# Patient Record
Sex: Male | Born: 1960 | Race: White | Hispanic: No | Marital: Married | Smoking: Former smoker
Health system: Southern US, Community
[De-identification: ages and names within clinical notes are randomized; demographics above are authoritative.]

## PROBLEM LIST (undated history)

## (undated) DIAGNOSIS — Z5189 Encounter for other specified aftercare: Secondary | ICD-10-CM

## (undated) DIAGNOSIS — E785 Hyperlipidemia, unspecified: Secondary | ICD-10-CM

## (undated) DIAGNOSIS — G4733 Obstructive sleep apnea (adult) (pediatric): Secondary | ICD-10-CM

## (undated) DIAGNOSIS — K219 Gastro-esophageal reflux disease without esophagitis: Secondary | ICD-10-CM

## (undated) DIAGNOSIS — IMO0001 Reserved for inherently not codable concepts without codable children: Secondary | ICD-10-CM

## (undated) DIAGNOSIS — M25579 Pain in unspecified ankle and joints of unspecified foot: Secondary | ICD-10-CM

## (undated) DIAGNOSIS — I471 Supraventricular tachycardia: Secondary | ICD-10-CM

## (undated) DIAGNOSIS — I10 Essential (primary) hypertension: Secondary | ICD-10-CM

## (undated) DIAGNOSIS — G8929 Other chronic pain: Secondary | ICD-10-CM

## (undated) HISTORY — PX: UMBILICAL HERNIA REPAIR: SHX196

## (undated) HISTORY — DX: Encounter for other specified aftercare: Z51.89

## (undated) HISTORY — DX: Gastro-esophageal reflux disease without esophagitis: K21.9

## (undated) HISTORY — DX: Pain in unspecified ankle and joints of unspecified foot: M25.579

## (undated) HISTORY — PX: APPENDECTOMY: SHX54

## (undated) HISTORY — DX: Other chronic pain: G89.29

## (undated) HISTORY — DX: Supraventricular tachycardia: I47.1

## (undated) HISTORY — DX: Essential (primary) hypertension: I10

## (undated) HISTORY — DX: Reserved for inherently not codable concepts without codable children: IMO0001

## (undated) HISTORY — PX: EXPLORATORY LAPAROTOMY: SUR591

## (undated) HISTORY — DX: Obstructive sleep apnea (adult) (pediatric): G47.33

## (undated) HISTORY — DX: Hyperlipidemia, unspecified: E78.5

## (undated) HISTORY — PX: INGUINAL HERNIA REPAIR: SUR1180

---

## 1983-02-27 HISTORY — PX: HUMERUS FRACTURE SURGERY: SHX670

## 2006-02-26 HISTORY — PX: OTHER SURGICAL HISTORY: SHX169

## 2006-04-08 ENCOUNTER — Ambulatory Visit: Payer: Self-pay | Admitting: Surgery

## 2007-06-16 ENCOUNTER — Ambulatory Visit: Payer: Self-pay | Admitting: Internal Medicine

## 2007-06-16 DIAGNOSIS — K644 Residual hemorrhoidal skin tags: Secondary | ICD-10-CM | POA: Insufficient documentation

## 2007-06-16 DIAGNOSIS — E785 Hyperlipidemia, unspecified: Secondary | ICD-10-CM | POA: Insufficient documentation

## 2007-06-16 DIAGNOSIS — E1169 Type 2 diabetes mellitus with other specified complication: Secondary | ICD-10-CM | POA: Insufficient documentation

## 2007-06-17 ENCOUNTER — Encounter: Payer: Self-pay | Admitting: Internal Medicine

## 2007-06-19 LAB — CONVERTED CEMR LAB
ALT: 105 units/L — ABNORMAL HIGH (ref 0–53)
AST: 60 units/L — ABNORMAL HIGH (ref 0–37)
Alkaline Phosphatase: 81 units/L (ref 39–117)
BUN: 9 mg/dL (ref 6–23)
Bilirubin, Direct: 0.1 mg/dL (ref 0.0–0.3)
Chloride: 103 meq/L (ref 96–112)
Creatinine, Ser: 1 mg/dL (ref 0.4–1.5)
Eosinophils Absolute: 0.1 10*3/uL (ref 0.0–0.7)
Eosinophils Relative: 1.3 % (ref 0.0–5.0)
GFR calc Af Amer: 103 mL/min
HCV Ab: POSITIVE — AB
HDL: 41.1 mg/dL (ref >39.0)
Hepatitis B Surface Ag: NEGATIVE
Lymphocytes Relative: 40.2 % (ref 12.0–46.0)
Monocytes Relative: 6.7 % (ref 3.0–12.0)
Neutrophils Relative %: 51.8 % (ref 43.0–77.0)
Platelets: 226 10*3/uL (ref 150–400)
Total CHOL/HDL Ratio: 6.1
Total Protein: 8.3 g/dL (ref 6.0–8.3)
Triglycerides: 160 mg/dL — ABNORMAL HIGH (ref 0–149)
VLDL: 32 mg/dL (ref 0–40)
WBC: 7 10*3/uL (ref 4.5–10.5)

## 2007-07-11 ENCOUNTER — Ambulatory Visit: Payer: Self-pay | Admitting: Internal Medicine

## 2007-07-16 LAB — CONVERTED CEMR LAB

## 2007-12-31 ENCOUNTER — Ambulatory Visit: Payer: Self-pay | Admitting: Internal Medicine

## 2008-02-23 ENCOUNTER — Ambulatory Visit: Payer: Self-pay | Admitting: Family Medicine

## 2008-04-09 ENCOUNTER — Ambulatory Visit: Payer: Self-pay | Admitting: Internal Medicine

## 2008-04-09 LAB — CONVERTED CEMR LAB
Bilirubin Urine: NEGATIVE
Nitrite: NEGATIVE
Urobilinogen, UA: 0.2

## 2008-04-12 LAB — CONVERTED CEMR LAB
Alkaline Phosphatase: 78 units/L (ref 39–117)
BUN: 12 mg/dL (ref 6–23)
Basophils Absolute: 0 10*3/uL (ref 0.0–0.1)
Bilirubin, Direct: 0.2 mg/dL (ref 0.0–0.3)
CO2: 28 meq/L (ref 19–32)
Chloride: 102 meq/L (ref 96–112)
Creatinine, Ser: 0.9 mg/dL (ref 0.4–1.5)
Eosinophils Absolute: 0.1 10*3/uL (ref 0.0–0.7)
GFR calc non Af Amer: 96 mL/min
MCHC: 34.8 g/dL (ref 30.0–36.0)
MCV: 87 fL (ref 78.0–100.0)
Neutrophils Relative %: 45.9 % (ref 43.0–77.0)
Platelets: 234 10*3/uL (ref 150–400)
RDW: 11.8 % (ref 11.5–14.6)
Total Bilirubin: 1.3 mg/dL — ABNORMAL HIGH (ref 0.3–1.2)
WBC: 5.8 10*3/uL (ref 4.5–10.5)

## 2009-01-18 ENCOUNTER — Ambulatory Visit: Payer: Self-pay | Admitting: Family Medicine

## 2009-02-01 ENCOUNTER — Encounter: Payer: Self-pay | Admitting: Family Medicine

## 2009-02-17 ENCOUNTER — Encounter: Payer: Self-pay | Admitting: Internal Medicine

## 2009-04-01 ENCOUNTER — Telehealth (INDEPENDENT_AMBULATORY_CARE_PROVIDER_SITE_OTHER): Payer: Self-pay

## 2009-05-18 ENCOUNTER — Ambulatory Visit: Payer: Self-pay | Admitting: Family Medicine

## 2009-05-18 DIAGNOSIS — R7402 Elevation of levels of lactic acid dehydrogenase (LDH): Secondary | ICD-10-CM | POA: Insufficient documentation

## 2009-05-18 DIAGNOSIS — R740 Nonspecific elevation of levels of transaminase and lactic acid dehydrogenase [LDH]: Secondary | ICD-10-CM

## 2009-05-18 DIAGNOSIS — E119 Type 2 diabetes mellitus without complications: Secondary | ICD-10-CM

## 2009-05-18 LAB — CONVERTED CEMR LAB
ALT: 79 units/L — ABNORMAL HIGH (ref 0–53)
AST: 54 units/L — ABNORMAL HIGH (ref 0–37)
Bilirubin, Direct: 0.1 mg/dL (ref 0.0–0.3)
Blood Glucose, Fasting: 120 mg/dL
Creatinine,U: 151.2 mg/dL
Total Bilirubin: 0.9 mg/dL (ref 0.3–1.2)

## 2009-05-19 ENCOUNTER — Encounter: Payer: Self-pay | Admitting: Family Medicine

## 2009-05-23 ENCOUNTER — Encounter: Payer: Self-pay | Admitting: Family Medicine

## 2009-06-13 ENCOUNTER — Encounter: Admission: RE | Admit: 2009-06-13 | Discharge: 2009-06-13 | Payer: Self-pay | Admitting: Family Medicine

## 2009-06-14 ENCOUNTER — Encounter: Payer: Self-pay | Admitting: Family Medicine

## 2009-06-23 ENCOUNTER — Ambulatory Visit: Payer: Self-pay | Admitting: Family Medicine

## 2009-10-25 ENCOUNTER — Ambulatory Visit: Payer: Self-pay | Admitting: Family Medicine

## 2009-10-25 DIAGNOSIS — B86 Scabies: Secondary | ICD-10-CM

## 2009-12-06 ENCOUNTER — Ambulatory Visit: Payer: Self-pay | Admitting: Family Medicine

## 2009-12-07 LAB — CONVERTED CEMR LAB
ALT: 38 units/L (ref 0–53)
AST: 30 units/L (ref 0–37)
Alkaline Phosphatase: 71 units/L (ref 39–117)
BUN: 13 mg/dL (ref 6–23)
Basophils Relative: 0.7 % (ref 0.0–3.0)
Chloride: 105 meq/L (ref 96–112)
Cholesterol: 204 mg/dL — ABNORMAL HIGH (ref 0–200)
Direct LDL: 138 mg/dL
Eosinophils Relative: 2 % (ref 0.0–5.0)
HCT: 46.9 % (ref 39.0–52.0)
Hemoglobin: 16.1 g/dL (ref 13.0–17.0)
Hgb A1c MFr Bld: 6.3 % (ref 4.6–6.5)
Lymphocytes Relative: 34.3 % (ref 12.0–46.0)
Lymphs Abs: 2 10*3/uL (ref 0.7–4.0)
Monocytes Relative: 7.9 % (ref 3.0–12.0)
Neutro Abs: 3.3 10*3/uL (ref 1.4–7.7)
Potassium: 4.6 meq/L (ref 3.5–5.1)
RBC: 5.28 M/uL (ref 4.22–5.81)
RDW: 12.7 % (ref 11.5–14.6)
Sodium: 137 meq/L (ref 135–145)
Total Bilirubin: 0.9 mg/dL (ref 0.3–1.2)
Total CHOL/HDL Ratio: 5
VLDL: 18.2 mg/dL (ref 0.0–40.0)
WBC: 6 10*3/uL (ref 4.5–10.5)

## 2009-12-29 ENCOUNTER — Encounter (INDEPENDENT_AMBULATORY_CARE_PROVIDER_SITE_OTHER): Payer: Self-pay | Admitting: *Deleted

## 2009-12-29 ENCOUNTER — Ambulatory Visit: Payer: Self-pay | Admitting: Family Medicine

## 2009-12-29 DIAGNOSIS — M25519 Pain in unspecified shoulder: Secondary | ICD-10-CM

## 2009-12-29 DIAGNOSIS — M79609 Pain in unspecified limb: Secondary | ICD-10-CM

## 2009-12-29 DIAGNOSIS — I10 Essential (primary) hypertension: Secondary | ICD-10-CM

## 2009-12-29 LAB — HM DIABETES FOOT EXAM

## 2009-12-29 LAB — CONVERTED CEMR LAB: LDL Goal: 70 mg/dL

## 2010-01-12 ENCOUNTER — Telehealth: Payer: Self-pay | Admitting: Family Medicine

## 2010-02-17 ENCOUNTER — Encounter (INDEPENDENT_AMBULATORY_CARE_PROVIDER_SITE_OTHER): Payer: Self-pay | Admitting: *Deleted

## 2010-02-21 ENCOUNTER — Ambulatory Visit: Payer: Self-pay | Admitting: Family Medicine

## 2010-02-22 ENCOUNTER — Encounter (INDEPENDENT_AMBULATORY_CARE_PROVIDER_SITE_OTHER): Payer: Self-pay | Admitting: *Deleted

## 2010-02-22 ENCOUNTER — Ambulatory Visit: Payer: Self-pay | Admitting: Gastroenterology

## 2010-02-28 ENCOUNTER — Telehealth: Payer: Self-pay | Admitting: Family Medicine

## 2010-03-01 ENCOUNTER — Encounter: Payer: Self-pay | Admitting: Family Medicine

## 2010-03-03 ENCOUNTER — Ambulatory Visit: Admit: 2010-03-03 | Payer: Self-pay | Admitting: Gastroenterology

## 2010-03-16 ENCOUNTER — Ambulatory Visit: Admit: 2010-03-16 | Payer: Self-pay | Admitting: Family Medicine

## 2010-03-16 ENCOUNTER — Encounter (INDEPENDENT_AMBULATORY_CARE_PROVIDER_SITE_OTHER): Payer: Self-pay | Admitting: *Deleted

## 2010-03-22 ENCOUNTER — Ambulatory Visit: Admit: 2010-03-22 | Payer: Self-pay | Admitting: Family Medicine

## 2010-03-28 NOTE — Letter (Signed)
Summary: Orders/[facility]  Orders/[facility]   Imported By: Lanelle Bal 05/26/2009 14:11:55  _____________________________________________________________________  External Attachment:    Type:   Image     Comment:   External Document

## 2010-03-28 NOTE — Letter (Signed)
Summary: Pre Visit Letter Revised  [facility]  [address] [place], [place] [address]   Phone: [phone]  Fax: [phone]        12/29/2009 MRN: [id number] ADAM SANJUAN [address] [place], [place]  [address]             Procedure Date:  03/03/2010   Welcome to the Gastroenterology Division at [facility].    You are scheduled to see a nurse for your pre-procedure visit on 02/23/2011 at 8:00AM on the 3rd floor at [facility], [address]  We ask that you try to arrive at our office 15 minutes prior to your appointment time to allow for check-in.  Please take a minute to review the attached form.  If you answer "Yes" to one or more of the questions on the first page, we ask that you call the person listed at your earliest opportunity.  If you answer "No" to all of the questions, please complete the rest of the form and bring it to your appointment.    Your nurse visit will consist of discussing your medical and surgical history, your immediate family medical history, and your medications.   If you are unable to list all of your medications on the form, please bring the medication bottles to your appointment and we will list them.  We will need to be aware of both prescribed and over the counter drugs.  We will need to know exact dosage information as well.    Please be prepared to read and sign documents such as consent forms, a financial agreement, and acknowledgement forms.  If necessary, and with your consent, a friend or relative is welcome to sit-in on the nurse visit with you.  Please bring your insurance card so that we may make a copy of it.  If your insurance requires a referral to see a specialist, please bring your referral form from your primary care physician.  No co-pay is required for this nurse visit.     If you cannot keep your appointment, please call [phone] to cancel or reschedule prior to your appointment date.  This allows  [place] the opportunity to schedule an appointment for another patient in need of care.    Thank you for choosing [facility] for your medical needs.  We appreciate the opportunity to care for you.  Please visit [place] at our website  to learn more about our practice.  Sincerely, The Gastroenterology Division

## 2010-03-28 NOTE — Miscellaneous (Signed)
Summary: PT Discharge/[facility]  PT Discharge/[facility]   Imported By: Lanelle Bal 02/28/2009 11:18:43  _____________________________________________________________________  External Attachment:    Type:   Image     Comment:   External Document
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
## 2010-03-28 NOTE — Letter (Signed)
Summary: [facility]  [facility]   Imported By: Lanelle Bal 06/21/2009 08:55:36  _____________________________________________________________________  External Attachment:    Type:   Image     Comment:   External Document

## 2010-03-28 NOTE — Progress Notes (Signed)
Summary: stopped simvastatin  Phone Note Call from Patient Call back at Home Phone [phone]   Caller: Patient Call For: Hannah Beat MD Summary of Call: Pt states he took simvastatin for a week.  He started getting pain in his neck, upper arm and thighs.  He stopped the medicine 2 days ago and the pain went away.  He said he is willing to try it again if you think the pain was caused by something else.  Please advise. Initial call taken by: Lowella Petties CMA, AAMA,  January 12, 2010 2:40 PM  Follow-up for Phone Call        likely is statin related, would cycle off. may be ok to reintroduce at the lowest dose in a few months Hannah Beat MD  January 13, 2010 10:16 AM   Advised pt, medicine removed from med list. Follow-up by: Lowella Petties CMA, AAMA,  January 13, 2010 11:35 AM     Prior Medications: ALEVE 220 MG TABS (NAPROXEN SODIUM) as needed METFORMIN HCL 500 MG XR24H-TAB (METFORMIN HCL) 1 by mouth daily GLUCOMETER () Check bs two times a day (250.00) GLUCOMETER GENERIC LANCETS () Check bs two times a day or as directed GLUCOMETER GENERIC TEST STRIPS () Check two times a day or as needed (250.00) FLUOCINOLONE ACETONIDE 0.025 % CREA (FLUOCINOLONE ACETONIDE) apply to area two times a day as needed itching CELEBREX 100 MG CAPS (CELECOXIB) patient not sure of doseage LISINOPRIL 10 MG TABS (LISINOPRIL) 1 by mouth daily UREA 40 % CREA (UREA) Apply two times a day to affected areas, dispense 1 month supply CELEBREX 200 MG CAPS (CELECOXIB) 1 by mouth daily (failure multiple other NSAIDS) Current Allergies: No known allergies
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
## 2010-03-30 NOTE — Letter (Signed)
Summary: Diabetic Instructions   Gastroenterology  [address] [place], [place] [address]   Phone: [phone]  Fax: [phone]    Omar Willis 1960-09-29 MRN: [id number]   (Metformin)  ORAL DIABETIC MEDICATION INSTRUCTIONS  The day before your procedure:   Take your diabetic pill as you do normally  The day of your procedure:   Do not take your diabetic pill    We will check your blood sugar levels during the admission process and again in Recovery before discharging you home  ________________________________________________________________________

## 2010-03-30 NOTE — Letter (Signed)
Summary: [facility] Instructions  [facility]  [address] [place], [place] [address]   Phone: [phone]  Fax: [phone]       EVANGELOS PAULINO    [birth year]    MRN: [id number]        Procedure Day /Date: Friday 03/03/10     Arrival Time: 9:00AM     Procedure Time: 10:00AM     Location of Procedure:                    _X_  [facility] (4th Floor)                        PREPARATION FOR COLONOSCOPY WITH MOVIPREP   Starting 5 days prior to your procedure 02/26/2010 do not eat nuts, seeds, popcorn, corn, beans, peas,  salads, or any raw vegetables.  Do not take any fiber supplements (e.g. Metamucil, Citrucel, and Benefiber).  THE DAY BEFORE YOUR PROCEDURE         DATE: Thurs.  DAY: 03/02/2010  1.  Drink clear liquids the entire day-NO SOLID FOOD  2.  Do not drink anything colored red or purple.  Avoid juices with pulp.  No orange juice.  3.  Drink at least 64 oz. (8 glasses) of fluid/clear liquids during the day to prevent dehydration and help the prep work efficiently.  CLEAR LIQUIDS INCLUDE: Water Jello Ice Popsicles Tea (sugar ok, no milk/cream) Powdered fruit flavored drinks Coffee (sugar ok, no milk/cream) Gatorade Juice: apple, white grape, white cranberry  Lemonade Clear bullion, consomm, broth Carbonated beverages (any kind) Strained chicken noodle soup Hard Candy                             4.  In the morning, mix first dose of MoviPrep solution:    Empty 1 Pouch A and 1 Pouch B into the disposable container    Add lukewarm drinking water to the top line of the container. Mix to dissolve    Refrigerate (mixed solution should be used within 24 hrs)  5.  Begin drinking the prep at 5:00 p.m. The MoviPrep container is divided by 4 marks.   Every 15 minutes drink the solution down to the next mark (approximately 8 oz) until the full liter is complete.   6.  Follow completed prep with 16 oz of clear liquid of your choice (Nothing  red or purple).  Continue to drink clear liquids until bedtime.  7.  Before going to bed, mix second dose of MoviPrep solution:    Empty 1 Pouch A and 1 Pouch B into the disposable container    Add lukewarm drinking water to the top line of the container. Mix to dissolve    Refrigerate  THE DAY OF YOUR PROCEDURE      DATE: 03/03/2010  DAY: Caleen Essex.  Beginning at 5:00 a.m. (5 hours before procedure):         1. Every 15 minutes, drink the solution down to the next mark (approx 8 oz) until the full liter is complete.  2. Follow completed prep with 16 oz. of clear liquid of your choice.    3. You may drink clear liquids until 8:00AM (2 HOURS BEFORE PROCEDURE).   MEDICATION INSTRUCTIONS  Unless otherwise instructed, you should take regular prescription medications with a small sip of water   as early as possible the morning of your procedure.  Diabetic patients - see separate instructions.           OTHER INSTRUCTIONS  You will need a responsible adult at least 51 years of age to accompany you and drive you home.   This person must remain in the waiting room during your procedure.  Wear loose fitting clothing that is easily removed.  Leave jewelry and other valuables at home.  However, you may wish to bring a book to read or  an iPod/MP3 player to listen to music as you wait for your procedure to start.  Remove all body piercing jewelry and leave at home.  Total time from sign-in until discharge is approximately 2-3 hours.  You should go home directly after your procedure and rest.  You can resume normal activities the  day after your procedure.  The day of your procedure you should not:   Drive   Make legal decisions   Operate machinery   Drink alcohol   Return to work  You will receive specific instructions about eating, activities and medications before you leave.    The above instructions have been reviewed and explained to me by   Ezra Sites RN  February 22, 2010 8:20 AM     I fully understand and can verbalize these instructions _____________________________ Date _________

## 2010-03-30 NOTE — Miscellaneous (Signed)
Summary: [facility]  Clinical Lists Changes  Medications: Added new medication of MOVIPREP 100 GM  SOLR (PEG-KCL-NACL-NASULF-NA ASC-C) As per prep instructions. - Signed Rx of MOVIPREP 100 GM  SOLR (PEG-KCL-NACL-NASULF-NA ASC-C) As per prep instructions.;  #1 x 0;  Signed;  Entered by: Ezra Sites RN;  Authorized by: Rachael Fee MD;  Method used: Electronically to [facility]*, [address], [place], [place]  [address], Ph: [phone], Fax: [phone] Observations: Added new observation of NKA: T (02/22/2010 8:01)    Prescriptions: MOVIPREP 100 GM  SOLR (PEG-KCL-NACL-NASULF-NA ASC-C) As per prep instructions.  #1 x 0   Entered by:   Ezra Sites RN   Authorized by:   Rachael Fee MD   Signed by:   Ezra Sites RN on 02/22/2010   Method used:   Electronically to        [facility]* (retail)       [address]       [place], [place]  [address]       Ph: [phone]       Fax: [phone]   RxID:   [id number]

## 2010-03-30 NOTE — Progress Notes (Signed)
Summary: prior auth needed for celebrex  Phone Note From Pharmacy   Caller: [facility]*/ [employer] Summary of Call: Prior Berkley Harvey is needed for celebrex, form is on your desk.                 Lowella Petties CMA, AAMA  February 28, 2010 3:15 PM      Appended Document: prior auth needed for celebrex Prior auth given for celebrex, approval letter placed on doctor's desk for signature and scanning.

## 2010-03-30 NOTE — Medication Information (Signed)
Summary: PA and Approval for Celebrex  PA and Approval for Celebrex   Imported By: Maryln Gottron 03/07/2010 14:25:28  _____________________________________________________________________  External Attachment:    Type:   Image     Comment:   External Document

## 2010-03-30 NOTE — Letter (Signed)
Summary: [facility] No Show Letter  [place] [facility]  [address] [place], [place] [address]   Phone: [phone]  Fax: [phone]    03/16/2010 MRN: [id number]  KELVIS BERGER [address] [place], [place]  [address]   Dear Mr. ANGLIN,   Our records indicate that you missed your scheduled appointment with ___lab__________________ on _1.19.2012_________.  Please contact this office to reschedule your appointment as soon as possible.  It is important that you keep your scheduled appointments with your physician, so we can provide you the best care possible.  Please be advised that there may be a charge for "no show" appointments.    Sincerely,   [place] [facility]

## 2010-05-25 ENCOUNTER — Telehealth: Payer: Self-pay | Admitting: Family Medicine

## 2010-05-25 DIAGNOSIS — E785 Hyperlipidemia, unspecified: Secondary | ICD-10-CM

## 2010-05-25 DIAGNOSIS — Z79899 Other long term (current) drug therapy: Secondary | ICD-10-CM

## 2010-05-25 NOTE — Telephone Encounter (Signed)
Future lab order  

## 2010-05-26 ENCOUNTER — Other Ambulatory Visit (INDEPENDENT_AMBULATORY_CARE_PROVIDER_SITE_OTHER): Payer: PRIVATE HEALTH INSURANCE | Admitting: Family Medicine

## 2010-05-26 DIAGNOSIS — Z79899 Other long term (current) drug therapy: Secondary | ICD-10-CM

## 2010-05-26 DIAGNOSIS — E785 Hyperlipidemia, unspecified: Secondary | ICD-10-CM

## 2010-05-26 LAB — HEPATIC FUNCTION PANEL
AST: 25 U/L (ref 0–37)
Alkaline Phosphatase: 69 U/L (ref 39–117)
Bilirubin, Direct: 0.2 mg/dL (ref 0.0–0.3)
Total Bilirubin: 1.1 mg/dL (ref 0.3–1.2)

## 2010-05-26 LAB — BASIC METABOLIC PANEL
CO2: 26 mEq/L (ref 19–32)
Calcium: 9.6 mg/dL (ref 8.4–10.5)
Chloride: 106 mEq/L (ref 96–112)
Creatinine, Ser: 0.9 mg/dL (ref 0.4–1.5)
Glucose, Bld: 101 mg/dL — ABNORMAL HIGH (ref 70–99)

## 2010-05-26 LAB — LIPID PANEL
LDL Cholesterol: 120 mg/dL — ABNORMAL HIGH (ref 0–99)
Total CHOL/HDL Ratio: 4

## 2010-05-31 ENCOUNTER — Encounter: Payer: Self-pay | Admitting: Family Medicine

## 2010-06-01 ENCOUNTER — Ambulatory Visit (INDEPENDENT_AMBULATORY_CARE_PROVIDER_SITE_OTHER): Payer: PRIVATE HEALTH INSURANCE | Admitting: Family Medicine

## 2010-06-01 ENCOUNTER — Encounter: Payer: Self-pay | Admitting: Family Medicine

## 2010-06-01 DIAGNOSIS — M719 Bursopathy, unspecified: Secondary | ICD-10-CM

## 2010-06-01 DIAGNOSIS — M25519 Pain in unspecified shoulder: Secondary | ICD-10-CM

## 2010-06-01 DIAGNOSIS — E119 Type 2 diabetes mellitus without complications: Secondary | ICD-10-CM

## 2010-06-01 DIAGNOSIS — R7309 Other abnormal glucose: Secondary | ICD-10-CM

## 2010-06-01 DIAGNOSIS — M67919 Unspecified disorder of synovium and tendon, unspecified shoulder: Secondary | ICD-10-CM

## 2010-06-01 DIAGNOSIS — F329 Major depressive disorder, single episode, unspecified: Secondary | ICD-10-CM

## 2010-06-01 DIAGNOSIS — I10 Essential (primary) hypertension: Secondary | ICD-10-CM

## 2010-06-01 DIAGNOSIS — E785 Hyperlipidemia, unspecified: Secondary | ICD-10-CM

## 2010-06-01 DIAGNOSIS — I6350 Cerebral infarction due to unspecified occlusion or stenosis of unspecified cerebral artery: Secondary | ICD-10-CM

## 2010-06-01 DIAGNOSIS — M7580 Other shoulder lesions, unspecified shoulder: Secondary | ICD-10-CM

## 2010-06-01 MED ORDER — ATORVASTATIN CALCIUM 10 MG PO TABS: 10.0000 mg | ORAL_TABLET | Freq: Every day | ORAL | Status: DC

## 2010-06-01 MED ORDER — LISINOPRIL 10 MG PO TABS: 10.0000 mg | ORAL_TABLET | Freq: Every day | ORAL | Status: DC

## 2010-06-01 NOTE — Patient Instructions (Signed)
F/u 3 months  Labs a few days before

## 2010-06-01 NOTE — Progress Notes (Signed)
50 year old several problems:  Diabetes Mellitus: Tolerating Medications: Compliance with diet: good Exercise: minimal outside of work but very active their Avg blood sugars at home: 100-130 Foot problems: none Hypoglycemia: none No nausea, vomitting, blurred vision, polyuria. No recent eye exam  Lab Results  Component Value Date   HGBA1C 6.3 12/06/2009      Chemistry      Component Value Date/Time   NA 139 05/26/2010 0955   K 4.4 05/26/2010 0955   CL 106 05/26/2010 0955   CO2 26 05/26/2010 0955   BUN 16 05/26/2010 0955   CREATININE 0.9 05/26/2010 0955      Component Value Date/Time   CALCIUM 9.6 05/26/2010 0955   ALKPHOS 69 05/26/2010 0955   AST 25 05/26/2010 0955   ALT 32 05/26/2010 0955   BILITOT 1.1 05/26/2010 0955      Lipids: not at goal Panel reviewed with patient. We had d/c meds previously with AST/ALT elevation and he had myalgias.  Lab Results  Component Value Date   CHOL 178 05/26/2010   CHOL 204* 12/06/2009   CHOL 250* 06/16/2007   Lab Results  Component Value Date   HDL 40.00 05/26/2010   HDL 69.62 12/06/2009   HDL 41.1 06/16/2007   Lab Results  Component Value Date   LDLCALC 120* 05/26/2010   Lab Results  Component Value Date   TRIG 89.0 05/26/2010   TRIG 91.0 12/06/2009   TRIG 160* 06/16/2007   Lab Results  Component Value Date   CHOLHDL 4 05/26/2010   CHOLHDL 5 12/06/2009   CHOLHDL 6.1 CALC 06/16/2007    Lab Results  Component Value Date   ALT 32 05/26/2010   AST 25 05/26/2010   ALKPHOS 69 05/26/2010   BILITOT 1.1 05/26/2010    HTN: ran out of BP meds - elevated now No CP, no sob. No HA.  BP Readings from Last 3 Encounters:  06/01/10 130/90  12/29/09 140/88  10/25/09 140/100    The patient noted above presents with shoulder pain that has been ongoing for many months, but now worsening over the last 2-3. there is history of distant humeral fx with ORIF, s/p hardware removal. The patient denies neck pain or radicular symptoms. Denies  dislocation, subluxation, separation of the shoulder. The patient does complain of pain in the overhead plane and restriction of motion.  Medications Tried: Alleve, Celebrex Ice or Heat: both Tried PT: No  The PMH, PSH, Social History, Family History, Medications, and allergies have been reviewed in [facility], and have been updated if relevant.  GEN: WDWN, NAD, Non-toxic, A & O x 3 HEENT: Atraumatic, Normocephalic. Neck supple. No masses, No LAD. Ears and Nose: No external deformity. CV: RRR, No M/G/R. No JVD. No thrill. No extra heart sounds. PULM: CTA B, no wheezes, crackles, rhonchi. No retractions. No resp. distress. No accessory muscle use. EXTR: No c/c/e NEURO Normal gait.  PSYCH: Normally interactive. Conversant. Not depressed or anxious appearing.  Calm demeanor.   GEN: Well-developed,well-nourished,in no acute distress; alert,appropriate and cooperative throughout examination HEENT: Normocephalic and atraumatic without obvious abnormalities. Ears, externally no deformities PULM: Breathing comfortably in no respiratory distress EXT: No clubbing, cyanosis, or edema PSYCH: Normally interactive. Cooperative during the interview. Pleasant. Friendly and conversant. Not anxious or depressed appearing. Normal, full affect.   Shoulder: R Inspection: No muscle wasting or winging Notable crepitus Ecchymosis/edema: neg  AC joint, scapula, clavicle: NT Cervical spine: NT, full ROM Spurling's: neg Abduction: loss 15 deg, 5/5 Flexion: full, 5/5  IR, loss 20 deg, lift-off: 5/5 ER at neutral: loss 10 deg, 5/5 AC crossover: pos Neer: pos Hawkins: pos Drop Test: neg Empty Can: pos Supraspinatus insertion: mild-mod T Bicipital groove: NT Speed's: neg Yergason's: neg Sulcus sign: neg Scapular dyskinesis: none C5-T1 intact  Neuro: Sensation intact Grip 5/5  A/P: diabetes mellitus: At goal, continue medicines Hypertension: Not at goal, restart lisinopril Hyperlipidemia: Not a goal,  start low-dose Lipitor  And recheck fasting lipid panel and hepatic panel in 3 months RIGHT shoulder pain, and subacromial impingement and rotator cuff tendinopathy.  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 Risks, benefits, and alternatives were explained. Patient prepped with Betadine and Ethyl Chloride used for anesthesia. The subacromial space was injected using the posterior approach. The patient tolerated the procedure well and had decreased pain post injection. No complications. Injection: 9 cc of Lidocaine 1% and 1cc of Kenalog 40 mg. Needle: 22 gauge

## 2010-06-06 ENCOUNTER — Other Ambulatory Visit: Payer: Self-pay | Admitting: *Deleted

## 2010-06-06 MED ORDER — CELECOXIB 200 MG PO CAPS: 200.0000 mg | ORAL_CAPSULE | Freq: Every day | ORAL | Status: DC

## 2010-06-06 MED ORDER — METFORMIN HCL ER 500 MG PO TB24: 500.0000 mg | ORAL_TABLET | Freq: Every day | ORAL | Status: DC

## 2010-06-14 ENCOUNTER — Other Ambulatory Visit: Payer: Self-pay | Admitting: *Deleted

## 2010-06-14 MED ORDER — METFORMIN HCL ER 500 MG PO TB24: 500.0000 mg | ORAL_TABLET | Freq: Every day | ORAL | Status: DC

## 2010-06-14 MED ORDER — CELECOXIB 200 MG PO CAPS: 200.0000 mg | ORAL_CAPSULE | Freq: Every day | ORAL | Status: DC

## 2010-06-15 NOTE — Telephone Encounter (Signed)
[name], has this been taken care of? 

## 2010-08-24 ENCOUNTER — Telehealth: Payer: Self-pay | Admitting: *Deleted

## 2010-08-24 ENCOUNTER — Other Ambulatory Visit (INDEPENDENT_AMBULATORY_CARE_PROVIDER_SITE_OTHER): Payer: PRIVATE HEALTH INSURANCE | Admitting: Family Medicine

## 2010-08-24 DIAGNOSIS — E785 Hyperlipidemia, unspecified: Secondary | ICD-10-CM

## 2010-08-24 DIAGNOSIS — E119 Type 2 diabetes mellitus without complications: Secondary | ICD-10-CM

## 2010-08-24 LAB — HEPATIC FUNCTION PANEL
ALT: 33 U/L (ref 0–53)
Albumin: 4.7 g/dL (ref 3.5–5.2)
Alkaline Phosphatase: 68 U/L (ref 39–117)
Bilirubin, Direct: 0.1 mg/dL (ref 0.0–0.3)
Total Protein: 7.6 g/dL (ref 6.0–8.3)

## 2010-08-24 LAB — HEMOGLOBIN A1C: Hgb A1c MFr Bld: 6.5 % (ref 4.6–6.5)

## 2010-08-24 LAB — LIPID PANEL
Cholesterol: 199 mg/dL (ref 0–200)
Triglycerides: 65 mg/dL (ref 0.0–149.0)
VLDL: 13 mg/dL (ref 0.0–40.0)

## 2010-08-24 MED ORDER — DOXYCYCLINE HYCLATE 100 MG PO CAPS: 100.0000 mg | ORAL_CAPSULE | Freq: Two times a day (BID) | ORAL | Status: DC

## 2010-08-24 NOTE — Telephone Encounter (Signed)
Call  Lyme titers would not be positive yet  Ok to call in  Doxycycline 100 mg, 1 po bid x 10 days, #20

## 2010-08-24 NOTE — Telephone Encounter (Signed)
Pt came in for blood work for A1C this morning.  He states that he has had several ticks on him and he has noticed some joint pain for the last 2 weeks.  He would like to be checked for Lyme's Disease, he has DM follow up appt with you in one week.  An extra tube of blood was drawn in case you agreed to the lyme's tests.  Please advise.

## 2010-08-24 NOTE — Telephone Encounter (Signed)
Patient advised and rx sent to pharmacy  

## 2010-08-31 ENCOUNTER — Encounter: Payer: Self-pay | Admitting: Family Medicine

## 2010-08-31 ENCOUNTER — Ambulatory Visit (INDEPENDENT_AMBULATORY_CARE_PROVIDER_SITE_OTHER): Payer: PRIVATE HEALTH INSURANCE | Admitting: Family Medicine

## 2010-08-31 DIAGNOSIS — I10 Essential (primary) hypertension: Secondary | ICD-10-CM

## 2010-08-31 DIAGNOSIS — E785 Hyperlipidemia, unspecified: Secondary | ICD-10-CM

## 2010-08-31 DIAGNOSIS — Z125 Encounter for screening for malignant neoplasm of prostate: Secondary | ICD-10-CM | POA: Insufficient documentation

## 2010-08-31 DIAGNOSIS — E119 Type 2 diabetes mellitus without complications: Secondary | ICD-10-CM

## 2010-08-31 DIAGNOSIS — Z79899 Other long term (current) drug therapy: Secondary | ICD-10-CM

## 2010-08-31 MED ORDER — NIACIN ER (ANTIHYPERLIPIDEMIC) 500 MG PO TBCR: 500.0000 mg | EXTENDED_RELEASE_TABLET | Freq: Every day | ORAL | Status: DC

## 2010-08-31 MED ORDER — NIACIN ER (ANTIHYPERLIPIDEMIC) 1000 MG PO TBCR: 1000.0000 mg | EXTENDED_RELEASE_TABLET | Freq: Every day | ORAL | Status: DC

## 2010-08-31 NOTE — Patient Instructions (Signed)
TAKE THE NIASPAN 500 MG 1 TABLET AT NIGHT BEFORE BED. AFTER 2 WEEKS, INCREASE TO 2 TABLETS  (THE MAIL OFF REFILL WILL BE TWICE AS STRONG, 1000 MG) Take a coated 81 mg aspirin before you take this medication  F/u 6 months for CPX Labs before

## 2010-08-31 NOTE — Progress Notes (Signed)
Omar Willis, a 50 y.o. male presents today in the office for the following:   Diabetes Mellitus: Tolerating Medications: y Compliance with diet: good Exercise: active Avg blood sugars at home: good Foot problems: none Hypoglycemia: none No nausea, vomitting, blurred vision, polyuria.  Lab Results  Component Value Date   HGBA1C 6.5 08/24/2010    Basic Metabolic Panel:    Component Value Date/Time   NA 139 05/26/2010 0955   K 4.4 05/26/2010 0955   CL 106 05/26/2010 0955   CO2 26 05/26/2010 0955   BUN 16 05/26/2010 0955   CREATININE 0.9 05/26/2010 0955   GLUCOSE 101* 05/26/2010 0955   CALCIUM 9.6 05/26/2010 0955   HTN: Tolerating all medications without side effects Stable and at goal No CP, no sob. No HA.  BP Readings from Last 3 Encounters:  08/31/10 130/80  06/01/10 130/90  12/29/09 140/88    Basic Metabolic Panel:    Component Value Date/Time   NA 139 05/26/2010 0955   K 4.4 05/26/2010 0955   CL 106 05/26/2010 0955   CO2 26 05/26/2010 0955   BUN 16 05/26/2010 0955   CREATININE 0.9 05/26/2010 0955   GLUCOSE 101* 05/26/2010 0955   CALCIUM 9.6 05/26/2010 0955   Lipids: bad myalgias Panel reviewed with patient.  Lipids:    Component Value Date/Time   CHOL 199 08/24/2010 0921   TRIG 65.0 08/24/2010 0921   HDL 43.50 08/24/2010 0921   LDLDIRECT 138.0 12/06/2009 0907   VLDL 13.0 08/24/2010 0921   CHOLHDL 5 08/24/2010 0921    Lab Results  Component Value Date   ALT 33 08/24/2010   AST 21 08/24/2010   ALKPHOS 68 08/24/2010   BILITOT 1.0 08/24/2010   HTN: Tolerating all medications without side effects Stable and at goal No CP, no sob. No HA.  BP Readings from Last 3 Encounters:  08/31/10 130/80  06/01/10 130/90  12/29/09 140/88    Basic Metabolic Panel:    Component Value Date/Time   NA 139 05/26/2010 0955   K 4.4 05/26/2010 0955   CL 106 05/26/2010 0955   CO2 26 05/26/2010 0955   BUN 16 05/26/2010 0955   CREATININE 0.9 05/26/2010 0955   GLUCOSE 101* 05/26/2010  0955   CALCIUM 9.6 05/26/2010 0955   The PMH, PSH, Social History, Family History, Medications, and allergies have been reviewed in [facility], and have been updated if relevant.  ROS: GEN: No acute illnesses, no fevers, chills. GI: No n/v/d, eating normally Pulm: No SOB Interactive and getting along well at home.  Otherwise, ROS is as per the HPI.   Physical Exam  Blood pressure 130/80, pulse 72, temperature 97.7 F (36.5 C), temperature source Oral, height 6\' 3"  (1.905 m), weight 205 lb 6.4 oz (93.169 kg), SpO2 98.00%.  GEN: WDWN, NAD, Non-toxic, A & O x 3 HEENT: Atraumatic, Normocephalic. Neck supple. No masses, No LAD. Ears and Nose: No external deformity. CV: RRR, No M/G/R. No JVD. No thrill. No extra heart sounds. PULM: CTA B, no wheezes, crackles, rhonchi. No retractions. No resp. distress. No accessory muscle use. ABD: S, NT, ND, +BS. No rebound tenderness. No HSM.  EXTR: No c/c/e NEURO Normal gait.  PSYCH: Normally interactive. Conversant. Not depressed or anxious appearing.  Calm demeanor.  Diabetic foot exam: Normal inspection No skin breakdown No calluses  Normal DP pulses Normal sensation to light tough Nails normal  Assessment and Plan: 1.  Diabetes: doing well 2. Lipids, unstable, titrate up niacin. ASA before 3. HTN;  at goal

## 2010-09-01 ENCOUNTER — Encounter: Payer: Self-pay | Admitting: Family Medicine

## 2010-10-17 ENCOUNTER — Telehealth: Payer: Self-pay | Admitting: *Deleted

## 2010-10-17 DIAGNOSIS — M2550 Pain in unspecified joint: Secondary | ICD-10-CM

## 2010-10-17 NOTE — Telephone Encounter (Signed)
ordered

## 2010-10-17 NOTE — Telephone Encounter (Signed)
Pt was treated a couple of months ago with antibiotic for possible lyme's disease.  His symptoms improved but have now returned- joint pain, neck pain, feels like he has the flu.  His wife is asking if he should be given more antibiotics.  Please advise.  Uses [facility].

## 2010-10-17 NOTE — Telephone Encounter (Signed)
No, i would not use more antibiotics. We could draw lyme titers if he would like to see if he did have lyme - at this point they should be positive if he had that infection. Would not do any more meds without doing that

## 2010-10-17 NOTE — Telephone Encounter (Signed)
Patients wife advised and they will call for appt for labs can you please order these

## 2010-10-19 ENCOUNTER — Other Ambulatory Visit (INDEPENDENT_AMBULATORY_CARE_PROVIDER_SITE_OTHER): Payer: PRIVATE HEALTH INSURANCE | Admitting: Family Medicine

## 2010-10-19 DIAGNOSIS — M2550 Pain in unspecified joint: Secondary | ICD-10-CM

## 2010-10-25 ENCOUNTER — Telehealth: Payer: Self-pay | Admitting: *Deleted

## 2010-10-25 MED ORDER — GABAPENTIN 300 MG PO CAPS: 300.0000 mg | ORAL_CAPSULE | Freq: Three times a day (TID) | ORAL | Status: DC

## 2010-10-25 NOTE — Telephone Encounter (Signed)
He is taken 300mg  tid right now. [facility] for one month .

## 2010-10-25 NOTE — Telephone Encounter (Signed)
Pt's wife states pt's feet and ankles are swollen and painful, left is worse than right- due to his diabetes. Has pain in toes and ball of feet.  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   He took some of her gabapentin and it did help the pain.  Wife is asking if pt can get a script for this, or would he need to be seen first.  She says the patient has discussed this problem with you before.

## 2010-10-25 NOTE — Telephone Encounter (Signed)
Sent in

## 2010-10-25 NOTE — Telephone Encounter (Signed)
That's fine.  What dose is he taking now - strength and frequency.  i know him well

## 2010-10-31 ENCOUNTER — Ambulatory Visit: Payer: Self-pay

## 2010-11-01 ENCOUNTER — Telehealth: Payer: Self-pay | Admitting: *Deleted

## 2010-11-01 NOTE — Telephone Encounter (Signed)
:  et pt know Dr. Patsy Lager will return next Monday..he is unable to check labs where he is at this time.

## 2010-11-01 NOTE — Telephone Encounter (Signed)
Patient called to get results of labs done on 10/19/2010.  I advised patient that the test was negative even though it looks as if they had not been reviewed by Dr. Patsy Lager.  Patient stated that he still has body aches and "just doesn't feel good" from time to time.  He wanted to know what the next steps would be in finding out what is wrong with him.  Please advise.

## 2010-11-02 NOTE — Telephone Encounter (Signed)
Patient advised.

## 2010-11-15 ENCOUNTER — Other Ambulatory Visit: Payer: Self-pay | Admitting: *Deleted

## 2010-11-15 MED ORDER — NIACIN ER (ANTIHYPERLIPIDEMIC) 1000 MG PO TBCR: 1000.0000 mg | EXTENDED_RELEASE_TABLET | Freq: Every day | ORAL | Status: DC

## 2010-11-15 MED ORDER — GABAPENTIN 300 MG PO CAPS: 300.0000 mg | ORAL_CAPSULE | Freq: Three times a day (TID) | ORAL | Status: DC

## 2010-11-15 NOTE — Telephone Encounter (Signed)
Pt needs written 90 day scripts to send to new mail order pharmacy.  He says the gabapentin has been helping well for him.  These scripts will need to be faxed to this new pharmacy at number [phone], member ID # needs to be on scripts, [id number], [facility] name [facility] pharmacy.  A cover sheet needs to be faxed with the scripts.

## 2010-11-15 NOTE — Telephone Encounter (Signed)
Please assist as noted above with faxing in.

## 2010-11-16 NOTE — Telephone Encounter (Signed)
Faxed to pharmacy

## 2010-11-20 ENCOUNTER — Telehealth: Payer: Self-pay | Admitting: *Deleted

## 2010-11-20 NOTE — Telephone Encounter (Signed)
Note continued from below, please give reference number [id number] when calling in.

## 2010-11-20 NOTE — Telephone Encounter (Signed)
Yes, please do this, change to #270, 3 refills

## 2010-11-20 NOTE — Telephone Encounter (Signed)
Pharmacy called regarding pt's gabapentin script.  This was written for #90, which is a one month supply since pt takes 3 a day and they are asking to change it to #270, for a 3 month supply, since they are a mail order pharmacy.  Please advise.

## 2010-11-20 NOTE — Telephone Encounter (Signed)
Pharmacy called and advised.

## 2011-02-26 ENCOUNTER — Other Ambulatory Visit (INDEPENDENT_AMBULATORY_CARE_PROVIDER_SITE_OTHER): Payer: PRIVATE HEALTH INSURANCE

## 2011-02-26 DIAGNOSIS — E785 Hyperlipidemia, unspecified: Secondary | ICD-10-CM

## 2011-02-26 DIAGNOSIS — E119 Type 2 diabetes mellitus without complications: Secondary | ICD-10-CM

## 2011-02-26 DIAGNOSIS — Z125 Encounter for screening for malignant neoplasm of prostate: Secondary | ICD-10-CM

## 2011-02-26 DIAGNOSIS — Z79899 Other long term (current) drug therapy: Secondary | ICD-10-CM

## 2011-02-26 LAB — LIPID PANEL
HDL: 43.3 mg/dL (ref >39.00)
Total CHOL/HDL Ratio: 5
VLDL: 19.2 mg/dL (ref 0.0–40.0)

## 2011-02-26 LAB — HEMOGLOBIN A1C: Hgb A1c MFr Bld: 6.4 % (ref 4.6–6.5)

## 2011-02-26 LAB — CBC WITH DIFFERENTIAL/PLATELET
Basophils Absolute: 0 10*3/uL (ref 0.0–0.1)
Basophils Relative: 0.8 % (ref 0.0–3.0)
Eosinophils Absolute: 0.2 10*3/uL (ref 0.0–0.7)
Lymphocytes Relative: 39 % (ref 12.0–46.0)
MCHC: 34.1 g/dL (ref 30.0–36.0)
Neutrophils Relative %: 49.8 % (ref 43.0–77.0)
RBC: 5.31 Mil/uL (ref 4.22–5.81)
RDW: 13 % (ref 11.5–14.6)

## 2011-02-26 LAB — LDL CHOLESTEROL, DIRECT: Direct LDL: 143.4 mg/dL

## 2011-02-26 LAB — HEPATIC FUNCTION PANEL
Alkaline Phosphatase: 64 U/L (ref 39–117)
Bilirubin, Direct: 0.1 mg/dL (ref 0.0–0.3)

## 2011-02-26 LAB — BASIC METABOLIC PANEL
CO2: 26 mEq/L (ref 19–32)
Calcium: 9.5 mg/dL (ref 8.4–10.5)
Creatinine, Ser: 0.8 mg/dL (ref 0.4–1.5)

## 2011-02-26 LAB — PSA: PSA: 0.21 ng/mL (ref 0.10–4.00)

## 2011-03-05 ENCOUNTER — Encounter: Payer: PRIVATE HEALTH INSURANCE | Admitting: Family Medicine

## 2011-03-05 ENCOUNTER — Other Ambulatory Visit: Payer: Self-pay | Admitting: *Deleted

## 2011-03-05 MED ORDER — GABAPENTIN 300 MG PO CAPS: 300.0000 mg | ORAL_CAPSULE | Freq: Three times a day (TID) | ORAL | Status: DC

## 2011-03-06 ENCOUNTER — Encounter: Payer: PRIVATE HEALTH INSURANCE | Admitting: Family Medicine

## 2011-03-12 ENCOUNTER — Encounter: Payer: Self-pay | Admitting: Family Medicine

## 2011-03-12 ENCOUNTER — Ambulatory Visit (INDEPENDENT_AMBULATORY_CARE_PROVIDER_SITE_OTHER): Payer: PRIVATE HEALTH INSURANCE | Admitting: Family Medicine

## 2011-03-12 ENCOUNTER — Encounter: Payer: Self-pay | Admitting: Gastroenterology

## 2011-03-12 VITALS — BP 130/80 | HR 69 | Temp 97.7°F | Ht 75.0 in | Wt 215.0 lb

## 2011-03-12 DIAGNOSIS — Z0000 Encounter for general adult medical examination without abnormal findings: Secondary | ICD-10-CM

## 2011-03-12 DIAGNOSIS — Z1211 Encounter for screening for malignant neoplasm of colon: Secondary | ICD-10-CM

## 2011-03-12 NOTE — Patient Instructions (Signed)
REFERRAL: GO THE THE FRONT ROOM AT THE ENTRANCE OF OUR CLINIC, NEAR CHECK IN. ASK FOR MARION. SHE WILL HELP YOU SET UP YOUR REFERRAL. DATE: TIME:  

## 2011-03-12 NOTE — Progress Notes (Signed)
Patient Name: Omar Willis Date of Birth: Jul 29, 1960 Age: 51 y.o. Medical Record Number: [id number] Gender: male Date of Encounter: 03/12/2011  History of Present Illness:  Omar Willis is a 51 y.o. very pleasant male patient who presents with the following:  Colonoscopy - has not had it last year. Needs that set up. Flu? -   Preventative Health Maintenance Visit:  Health Maintenance Summary Reviewed and updated, unless pt declines services.  Tobacco History Reviewed. Alcohol: No concerns, no excessive use Exercise Habits: none STD concerns: no risk or activity to increase risk Drug Use: None Encouraged self-testicular check  Health Maintenance  Topic Date Due  . Urine Microalbumin  04/18/1969  . Colonoscopy  04/18/2009  . Ophthalmology Exam  04/03/2011  . Hemoglobin A1c  08/26/2011  . Influenza Vaccine  11/27/2011  . Foot Exam  03/11/2012  . Pneumococcal Polysaccharide Vaccine (#2) 06/24/2014  . Tetanus/tdap  02/27/2016    Labs reviewed with the patient.   Lipids:    Component Value Date/Time   CHOL 210* 02/26/2011 0832   TRIG 96.0 02/26/2011 0832   HDL 43.30 02/26/2011 0832   LDLDIRECT 143.4 02/26/2011 0832   VLDL 19.2 02/26/2011 0832   CHOLHDL 5 02/26/2011 0832    CBC:    Component Value Date/Time   WBC 6.1 02/26/2011 0832   HGB 16.0 02/26/2011 0832   HCT 47.0 02/26/2011 0832   PLT 223.0 02/26/2011 0832   MCV 88.5 02/26/2011 0832   NEUTROABS 3.0 02/26/2011 0832   LYMPHSABS 2.4 02/26/2011 0832   MONOABS 0.5 02/26/2011 0832   EOSABS 0.2 02/26/2011 0832   BASOSABS 0.0 02/26/2011 0832    Basic Metabolic Panel:    Component Value Date/Time   NA 139 02/26/2011 0832   K 4.6 02/26/2011 0832   CL 106 02/26/2011 0832   CO2 26 02/26/2011 0832   BUN 16 02/26/2011 0832   CREATININE 0.8 02/26/2011 0832   GLUCOSE 122* 02/26/2011 0832   CALCIUM 9.5 02/26/2011 0832    Lab Results  Component Value Date   ALT 58* 02/26/2011   AST 34 02/26/2011   ALKPHOS  64 02/26/2011   BILITOT 0.9 02/26/2011    Lab Results  Component Value Date   PSA 0.21 02/26/2011   PSA 0.27 12/06/2009     Hernia? Or at least anterior / right abdomen -- is bothering him a lot. Feeling nauseous. Has been there 6 months or less. Felt it and went away and now becoming more constant. Some burning and nausea in LQ. Does not feel like a hernia will not bulge  Past Medical History, Surgical History, Social History, Family History, Problem List, Medications, and Allergies have been reviewed and updated if relevant.  Review of Systems:  General: Denies fever, chills, sweats. No significant weight loss. Eyes: Denies blurring,significant itching ENT: Denies earache, sore throat, and hoarseness. Cardiovascular: Denies chest pains, palpitations, dyspnea on exertion Respiratory: Denies cough, dyspnea at rest,wheeezing Breast: no concerns about lumps GI: as above, no melena, hematochezia GU: Denies penile discharge, ED, urinary flow / outflow problems. No STD concerns. Musculoskeletal: Denies back pain, joint pain Derm: Denies rash, itching Neuro: Denies  paresthesias, frequent falls, frequent headaches Psych: Denies depression, anxiety Endocrine: Denies cold intolerance, heat intolerance, polydipsia Heme: Denies enlarged lymph nodes Allergy: No hayfever   Physical Examination: Filed Vitals:   03/12/11 0819  BP: 130/80  Pulse: 69  Temp: 97.7 F (36.5 C)  TempSrc: Oral  Height: 6\' 3"  (1.905 m)  Weight: 215 lb (97.523 kg)  SpO2: 98%    Body mass index is 26.87 kg/(m^2).   Wt Readings from Last 3 Encounters:  03/12/11 215 lb (97.523 kg)  08/31/10 205 lb 6.4 oz (93.169 kg)  06/01/10 201 lb 12.8 oz (91.536 kg)    GEN: well developed, well nourished, no acute distress Eyes: conjunctiva and lids normal, PERRLA, EOMI ENT: TM clear, nares clear, oral exam WNL Neck: supple, no lymphadenopathy, no thyromegaly, no JVD Pulm: clear to auscultation and percussion,  respiratory effort normal CV: regular rate and rhythm, S1-S2, no murmur, rub or gallop, no bruits, peripheral pulses normal and symmetric, no cyanosis, clubbing, edema or varicosities Chest: no scars, masses GI: soft, minimal RLQ tenderness; no hepatosplenomegaly, masses; active bowel sounds all quadrants. Large scar GU: no hernia, testicular mass, penile discharge, or prostate enlargement Lymph: no cervical, axillary or inguinal adenopathy MSK: gait normal, muscle tone and strength WNL, no joint swelling, effusions, discoloration, crepitus  SKIN: clear, good turgor, color WNL, no rashes, lesions, or ulcerations Neuro: normal mental status, normal strength, sensation, and motion Psych: alert; oriented to person, place and time, normally interactive and not anxious or depressed in appearance.   Assessment and Plan: Health Maint: The patient's preventative maintenance and recommended screening tests for an annual wellness exam were reviewed in full today. Brought up to date unless services declined.  Counselled on the importance of diet, exercise, and its role in overall health and mortality. The patient's FH and SH was reviewed, including their home life, tobacco status, and drug and alcohol status.   Restart 1/2 dose niacin for him. Intol to statins  Colon referal For now, start with colon - if normal may need ct vs gastro referral if persists

## 2011-04-23 ENCOUNTER — Ambulatory Visit (AMBULATORY_SURGERY_CENTER): Payer: PRIVATE HEALTH INSURANCE | Admitting: *Deleted

## 2011-04-23 VITALS — Ht 75.0 in | Wt 213.4 lb

## 2011-04-23 DIAGNOSIS — Z1211 Encounter for screening for malignant neoplasm of colon: Secondary | ICD-10-CM

## 2011-04-23 MED ORDER — PEG-KCL-NACL-NASULF-NA ASC-C 100 G PO SOLR: ORAL | Status: DC

## 2011-04-27 ENCOUNTER — Ambulatory Visit: Payer: Self-pay | Admitting: Family

## 2011-05-07 ENCOUNTER — Ambulatory Visit (AMBULATORY_SURGERY_CENTER): Payer: PRIVATE HEALTH INSURANCE | Admitting: Gastroenterology

## 2011-05-07 ENCOUNTER — Encounter: Payer: Self-pay | Admitting: Gastroenterology

## 2011-05-07 VITALS — BP 124/92 | HR 72 | Temp 96.2°F | Resp 12 | Ht 75.0 in | Wt 213.0 lb

## 2011-05-07 DIAGNOSIS — D128 Benign neoplasm of rectum: Secondary | ICD-10-CM

## 2011-05-07 DIAGNOSIS — D129 Benign neoplasm of anus and anal canal: Secondary | ICD-10-CM

## 2011-05-07 DIAGNOSIS — K5730 Diverticulosis of large intestine without perforation or abscess without bleeding: Secondary | ICD-10-CM

## 2011-05-07 DIAGNOSIS — D126 Benign neoplasm of colon, unspecified: Secondary | ICD-10-CM

## 2011-05-07 DIAGNOSIS — Z1211 Encounter for screening for malignant neoplasm of colon: Secondary | ICD-10-CM

## 2011-05-07 MED ORDER — SODIUM CHLORIDE 0.9 % IV SOLN: 500.0000 mL | INTRAVENOUS | Status: DC

## 2011-05-07 NOTE — Progress Notes (Signed)
Patient did not experience any of the following events: a burn prior to discharge; a fall within the facility; wrong site/side/patient/procedure/implant event; or a hospital transfer or hospital admission upon discharge from the facility. (G8907) Patient did not have preoperative order for IV antibiotic SSI prophylaxis. (G8918)  

## 2011-05-07 NOTE — Patient Instructions (Signed)
Discharge instructions given with verbal understanding. Handouts on diverticulosis and polyps given. Resume previous medications. YOU HAD AN ENDOSCOPIC PROCEDURE TODAY AT THE [facility]: Refer to the procedure report that was given to you for any specific questions about what was found during the examination.  If the procedure report does not answer your questions, please call your gastroenterologist to clarify.  If you requested that your care partner not be given the details of your procedure findings, then the procedure report has been included in a sealed envelope for you to review at your convenience later.  YOU SHOULD EXPECT: Some feelings of bloating in the abdomen. Passage of more gas than usual.  Walking can help get rid of the air that was put into your GI tract during the procedure and reduce the bloating. If you had a lower endoscopy (such as a colonoscopy or flexible sigmoidoscopy) you may notice spotting of blood in your stool or on the toilet paper. If you underwent a bowel prep for your procedure, then you may not have a normal bowel movement for a few days.  DIET: Your first meal following the procedure should be a light meal and then it is ok to progress to your normal diet.  A half-sandwich or bowl of soup is an example of a good first meal.  Heavy or fried foods are harder to digest and may make you feel nauseous or bloated.  Likewise meals heavy in dairy and vegetables can cause extra gas to form and this can also increase the bloating.  Drink plenty of fluids but you should avoid alcoholic beverages for 24 hours.  ACTIVITY: Your care partner should take you home directly after the procedure.  You should plan to take it easy, moving slowly for the rest of the day.  You can resume normal activity the day after the procedure however you should NOT DRIVE or use heavy machinery for 24 hours (because of the sedation medicines used during the test).    SYMPTOMS TO REPORT  IMMEDIATELY: A gastroenterologist can be reached at any hour.  During normal business hours, 8:30 AM to 5:00 PM Monday through Friday, call [phone].  After hours and on weekends, please call the GI answering service at [phone] who will take a message and have the physician on call contact you.   Following lower endoscopy (colonoscopy or flexible sigmoidoscopy):  Excessive amounts of blood in the stool  Significant tenderness or worsening of abdominal pains  Swelling of the abdomen that is new, acute  Fever of 100F or higher  FOLLOW UP: If any biopsies were taken you will be contacted by phone or by letter within the next 1-3 weeks.  Call your gastroenterologist if you have not heard about the biopsies in 3 weeks.  Our staff will call the home number listed on your records the next business day following your procedure to check on you and address any questions or concerns that you may have at that time regarding the information given to you following your procedure. This is a courtesy call and so if there is no answer at the home number and we have not heard from you through the emergency physician on call, we will assume that you have returned to your regular daily activities without incident.  SIGNATURES/CONFIDENTIALITY: You and/or your care partner have signed paperwork which will be entered into your electronic medical record.  These signatures attest to the fact that that the information above on your After Visit   Summary has been reviewed and is understood.  Full responsibility of the confidentiality of this discharge information lies with you and/or your care-partner.  

## 2011-05-07 NOTE — Op Note (Signed)
[facility] [address] [place], [place]  [address]  COLONOSCOPY PROCEDURE REPORT  PATIENT:  Omar Willis, Omar Willis  MR#:  [id number] BIRTHDATE:  08-28-1960, 51 yrs. old  GENDER:  male ENDOSCOPIST:  Rachael Fee, MD REF. BY:  Karleen Hampshire T. Copland, M.D. PROCEDURE DATE:  05/07/2011 PROCEDURE:  Colonoscopy with biopsy ASA CLASS:  Class II INDICATIONS:  Routine Risk Screening MEDICATIONS:   Fentanyl 50 mcg IV, These medications were titrated to patient response per physician's verbal order, Versed 7 mg IV  DESCRIPTION OF PROCEDURE:   After the risks benefits and alternatives of the procedure were thoroughly explained, informed consent was obtained.  Digital rectal exam was performed and revealed no rectal masses.   The LB 180AL [id number] endoscope was introduced through the anus and advanced to the cecum, which was identified by both the appendix and ileocecal valve, without limitations.  The quality of the prep was good..  The instrument was then slowly withdrawn as the colon was fully examined. <<PROCEDUREIMAGES>> FINDINGS:  A diminutive polyp was found in the ascending colon. This was removed with forceps, sent to pathology (jar 1) (see image4).  Mild diverticulosis was found in the sigmoid to descending colon segments (see image5).  This was otherwise a normal examination of the colon (see image2, image1, and image6). Retroflexed views in the rectum revealed no abnormalities. COMPLICATIONS:  None  ENDOSCOPIC IMPRESSION: 1) Diminutive polyp in the ascending colon; removed and sent to pathology 2) Mild diverticulosis in the sigmoid to descending colon segments 3) Otherwise normal examination  RECOMMENDATIONS: 1) If the polyp(s) removed today are proven to be adenomatous (pre-cancerous) polyps, you will need a repeat colonoscopy in 5 years. Otherwise you should continue to follow colorectal cancer screening guidelines for "routine risk" patients with colonoscopy in 10  years. You will receive a letter within 1-2 weeks with the results of your biopsy as well as final recommendations. Please call my office if you have not received a letter after 3 weeks.  ______________________________ Rachael Fee, MD  n. eSIGNED:   Rachael Fee at 05/07/2011 09:09 AM  Marlana Salvage, [phone]

## 2011-05-08 ENCOUNTER — Telehealth: Payer: Self-pay | Admitting: *Deleted

## 2011-05-08 NOTE — Telephone Encounter (Signed)
  Follow up Call-  Call back number 05/07/2011  Post procedure Call Back phone  # [phone]  Permission to leave phone message Yes     Patient questions:  Do you have a fever, pain , or abdominal swelling? no Pain Score  0 *  Have you tolerated food without any problems? yes  Have you been able to return to your normal activities? yes  Do you have any questions about your discharge instructions: Diet   no Medications  no Follow up visit  no  Do you have questions or concerns about your Care? no  Actions: * If pain score is 4 or above: No action needed, pain <4.

## 2011-05-11 ENCOUNTER — Encounter: Payer: Self-pay | Admitting: Gastroenterology

## 2012-02-29 ENCOUNTER — Ambulatory Visit: Payer: Self-pay | Admitting: Emergency Medicine

## 2012-02-29 LAB — DOT URINE DIP
Blood: NEGATIVE
Glucose,UR: NEGATIVE mg/dL (ref 0–75)
Protein: NEGATIVE

## 2012-09-04 ENCOUNTER — Other Ambulatory Visit: Payer: Self-pay

## 2014-05-16 DIAGNOSIS — I471 Supraventricular tachycardia, unspecified: Secondary | ICD-10-CM

## 2014-05-16 HISTORY — DX: Supraventricular tachycardia, unspecified: I47.10

## 2014-05-16 HISTORY — DX: Supraventricular tachycardia: I47.1

## 2014-05-17 HISTORY — PX: NM MYOVIEW LTD: HXRAD82

## 2014-05-17 HISTORY — PX: TRANSTHORACIC ECHOCARDIOGRAM: SHX275

## 2014-06-16 ENCOUNTER — Encounter: Payer: Self-pay | Admitting: Cardiology

## 2014-06-16 ENCOUNTER — Ambulatory Visit (INDEPENDENT_AMBULATORY_CARE_PROVIDER_SITE_OTHER): Payer: BLUE CROSS/BLUE SHIELD | Admitting: Cardiology

## 2014-06-16 VITALS — BP 134/77 | HR 87 | Ht 75.0 in | Wt 221.5 lb

## 2014-06-16 DIAGNOSIS — E119 Type 2 diabetes mellitus without complications: Secondary | ICD-10-CM | POA: Diagnosis not present

## 2014-06-16 DIAGNOSIS — I456 Pre-excitation syndrome: Secondary | ICD-10-CM

## 2014-06-16 DIAGNOSIS — E785 Hyperlipidemia, unspecified: Secondary | ICD-10-CM | POA: Diagnosis not present

## 2014-06-16 DIAGNOSIS — I10 Essential (primary) hypertension: Secondary | ICD-10-CM | POA: Diagnosis not present

## 2014-06-16 DIAGNOSIS — I480 Paroxysmal atrial fibrillation: Secondary | ICD-10-CM | POA: Diagnosis not present

## 2014-06-16 MED ORDER — RIVAROXABAN 20 MG PO TABS: 20.0000 mg | ORAL_TABLET | Freq: Every day | ORAL | Status: DC

## 2014-06-16 MED ORDER — DILTIAZEM HCL 90 MG PO TABS: ORAL_TABLET | ORAL | Status: DC

## 2014-06-16 MED ORDER — DILTIAZEM HCL 120 MG PO TABS: ORAL_TABLET | ORAL | Status: DC
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
## 2014-06-16 NOTE — Patient Instructions (Addendum)
Your physician has recommended you make the following change in your medication:  1) Start Xarelto 20 mg one tablet by mouth once daily 2) Decrease metoprolol tartrate to 25 mg one tablet by mouth twice daily x 1 week then stop 3) Start diltiazem 90 mg one tablet by mouth twice daily x 1 week, then 4) Start diltiazem 120 mg one tablet by mouth twice daily  5) you may take metoprolol tartrate 25 mg one tablet twice daily as needed for fast heart rates  Your physician has recommended that you wear a 30 day event monitor. Event monitors are medical devices that record the heart's electrical activity. Doctors most often [place] these monitors to diagnose arrhythmias. Arrhythmias are problems with the speed or rhythm of the heartbeat. The monitor is a small, portable device. You can wear one while you do your normal daily activities. This is usually used to diagnose what is causing palpitations/syncope (passing out).  Your physician recommends that you schedule a follow-up appointment in: 6 weeks with Dr. Ellyn Hack

## 2014-06-17 ENCOUNTER — Encounter: Payer: Self-pay | Admitting: Cardiology

## 2014-06-17 NOTE — Assessment & Plan Note (Signed)
I'm not sure this is the true diagnosis, but we will see him once we get the records. In order to determine his burden of disease since he has noted some palpitations since discharge, we will have him wear a monitor for 30 days. This will allow [place] to detect any true arrhythmias including preexcitation. He reportedly is artery had an echocardiogram and stress test therefore these did not need to be repeated.  I am concerned that he is having exercise intolerance that it may be related to the beta blocker since it sort of coincided with his starting the beta blocker. We will switch him from beta blocker to calcium channel blocker lowly tapering off the beta blocker. CHADS2VASC2 - (diabetes, HTN) = 2 - we discussed the pros and cons of full anticoagulation using the [facility] calculator score and percentages of disease/concern. The patient is inclined to avoid stroke at [facility] cost. He agrees to start anticoagulation and would prefer a DOAC.  1) Start Xarelto 20 mg one tablet by mouth once daily 2) Decrease metoprolol tartrate to 25 mg one tablet by mouth twice daily x 1 week then stop 3) Start diltiazem 90 mg one tablet by mouth twice daily x 1 week, then 4) Start diltiazem 120 mg one tablet by mouth twice daily 5) Metoprolol tartrate 25 mg one tablet twice daily as needed for fast heart rates 6) 30 day event monitor

## 2014-06-17 NOTE — Assessment & Plan Note (Signed)
Not currently on therapy. Monitored by PCP.

## 2014-06-17 NOTE — Assessment & Plan Note (Signed)
Essentially well treated. We may need to adjust some of his antihypertensive regimen since he is now switching out from beta blocker to calcium channel blocker.

## 2014-06-21 ENCOUNTER — Telehealth: Payer: Self-pay | Admitting: Cardiology

## 2014-06-21 NOTE — Telephone Encounter (Signed)
Paged from [employer] to report 3.1 second auto triggered sinus pause.

## 2014-06-28 DIAGNOSIS — I471 Supraventricular tachycardia: Secondary | ICD-10-CM | POA: Insufficient documentation

## 2014-07-28 ENCOUNTER — Encounter: Payer: Self-pay | Admitting: Cardiology

## 2014-07-28 ENCOUNTER — Ambulatory Visit (INDEPENDENT_AMBULATORY_CARE_PROVIDER_SITE_OTHER): Payer: BLUE CROSS/BLUE SHIELD | Admitting: Cardiology

## 2014-07-28 VITALS — BP 128/76 | HR 88 | Ht 76.0 in | Wt 221.8 lb

## 2014-07-28 DIAGNOSIS — R001 Bradycardia, unspecified: Secondary | ICD-10-CM | POA: Diagnosis not present

## 2014-07-28 DIAGNOSIS — I10 Essential (primary) hypertension: Secondary | ICD-10-CM | POA: Diagnosis not present

## 2014-07-28 DIAGNOSIS — I471 Supraventricular tachycardia: Secondary | ICD-10-CM | POA: Diagnosis not present
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
## 2014-07-28 NOTE — Patient Instructions (Addendum)
Medication Instructions:  Your physician has recommended you make the following change in your medication:  STOP taking Xarelto   Labwork: none  Testing/Procedures: Your physician has recommended that you have a sleep study. This test records several body functions during sleep, including: brain activity, eye movement, oxygen and carbon dioxide blood levels, heart rate and rhythm, breathing rate and rhythm, the flow of air through your mouth and nose, snoring, body muscle movements, and chest and belly movement.  Sleep study location: [facility] [phone]   Follow-Up: Your physician wants you to follow-up in: six months with Dr. Ellyn Hack.  You will receive a reminder letter in the mail two months in advance. If you don't receive a letter, please call our office to schedule the follow-up appointment.   Any Other Special Instructions Will Be Listed Below (If Applicable).

## 2014-07-29 ENCOUNTER — Encounter: Payer: Self-pay | Admitting: Cardiology

## 2014-07-29 DIAGNOSIS — R001 Bradycardia, unspecified: Secondary | ICD-10-CM | POA: Insufficient documentation

## 2014-07-29 NOTE — Assessment & Plan Note (Signed)
Well-controlled with diltiazem 

## 2014-07-29 NOTE — Assessment & Plan Note (Signed)
Very infrequent episodes. Continued standing dose of diltiazem. I would be reluctant to increase the dose due to his nocturnal pauses. We also talked about vagal maneuvers and the potential use of when necessary metoprolol which he still has. We'll start the midportion so stopping what he is doing getting down on the ground or lying down the episode were to recur.  Since he does not have atrial fibrillation record, we can stop the Xarelto.

## 2014-07-29 NOTE — Assessment & Plan Note (Signed)
Probably these are benign as they are occurring in the early morning. I am concerned about the possibility of OSA.  Plan: Polysomnogram

## 2014-10-15 ENCOUNTER — Ambulatory Visit (HOSPITAL_BASED_OUTPATIENT_CLINIC_OR_DEPARTMENT_OTHER): Payer: BLUE CROSS/BLUE SHIELD | Attending: Cardiology | Admitting: *Deleted

## 2014-10-15 VITALS — Ht 76.0 in | Wt 217.5 lb

## 2014-10-15 DIAGNOSIS — R001 Bradycardia, unspecified: Secondary | ICD-10-CM

## 2014-10-15 DIAGNOSIS — R0683 Snoring: Secondary | ICD-10-CM | POA: Diagnosis not present

## 2014-10-15 DIAGNOSIS — I119 Hypertensive heart disease without heart failure: Secondary | ICD-10-CM

## 2014-10-15 DIAGNOSIS — I493 Ventricular premature depolarization: Secondary | ICD-10-CM | POA: Insufficient documentation

## 2014-10-15 DIAGNOSIS — G4733 Obstructive sleep apnea (adult) (pediatric): Secondary | ICD-10-CM | POA: Diagnosis present

## 2014-10-17 ENCOUNTER — Encounter: Payer: Self-pay | Admitting: Cardiology

## 2014-10-17 NOTE — Telephone Encounter (Signed)
This encounter was created in error - please disregard.

## 2014-10-21 ENCOUNTER — Telehealth: Payer: Self-pay | Admitting: Cardiology

## 2014-10-21 ENCOUNTER — Encounter (HOSPITAL_BASED_OUTPATIENT_CLINIC_OR_DEPARTMENT_OTHER): Payer: Self-pay | Admitting: *Deleted

## 2014-10-21 DIAGNOSIS — G4733 Obstructive sleep apnea (adult) (pediatric): Secondary | ICD-10-CM

## 2014-10-21 HISTORY — DX: Obstructive sleep apnea (adult) (pediatric): G47.33

## 2014-10-21 NOTE — Sleep Study (Signed)
SLEEP STUDY NOTE   Patient Name: Omar Willis, Omar Willis Date: 10/15/2014 MRN: [id number] Gender: Male D.O.B: 08/23/1960 Age (years): 53 Referring Provider: Glenetta Hew Interpreting Physician: Fransico Him MD, ABSM RPSGT: [place], Holly  Weight (lbs): 217 BMI: 26 Height (inches): 76 Neck Size: 16.00  CLINICAL INFORMATION  Sleep Study Type: Split Night CPAP  Indication for sleep study: Excessive Daytime Sleepiness, Fatigue, OSA, Snoring  Epworth Sleepiness Score: 17  SLEEP STUDY TECHNIQUE As per the AASM Manual for the Scoring of Sleep and Associated Events v2.3 (April 2016) with a hypopnea requiring 4% desaturations.  The channels recorded and monitored were frontal, central and occipital EEG, electrooculogram (EOG), submentalis EMG (chin), nasal and oral airflow, thoracic and abdominal wall motion, anterior tibialis EMG, snore microphone, electrocardiogram, and pulse oximetry. Continuous positive airway pressure (CPAP) was initiated when the patient met split night criteria and was titrated according to treat sleep-disordered breathing.  MEDICATIONS Medications taken by the patient : Tramadol, Cardiazem, Metformin Medications administered by patient during sleep study : No sleep medicine administered.  RESPIRATORY PARAMETERS  Diagnostic Total AHI (/hr): 66.9  RDI (/hr):71.4  OA Index (/hr): 30.7  CA Index (/hr): 0.0 REM AHI (/hr): 63.4  NREM AHI (/hr):67.9 Supine AHI (/hr):55.7  Non-supine AHI (/hr): 68.41 Min O2 Sat (%):68.00  Mean O2 (%): 90.81 Time below 88% (min):30.0    Titration Optimal Pressure (cm):8  AHI at Optimal Pressure (/hr):0.0 Min O2 at Optimal Pressure (%):92.0 Supine % at Optimal (%):100  Sleep % at Optimal (%):100    SLEEP ARCHITECTURE The recording time for the entire night was 430.9 minutes.  During a baseline period of 210.8 minutes, the patient slept for 121.0 minutes in REM and nonREM, yielding a reduced sleep efficiency of 57.4%.  Sleep onset after lights out was prolonged at 20.2 minutes with a prolonged REM latency of 145.5 minutes. The patient spent 4.13% of the night in stage N1 sleep, 71.07% in stage N2 sleep, 2.89% in stage N3 and 21.90% in REM.     During the titration period of 214.6 minutes, the patient slept for 197.7 minutes in REM and nonREM, yielding a sleep efficiency of 92.1%. Sleep onset after CPAP initiation was 14.9 minutes with a REM latency of 60.5 minutes. The patient spent 2.02% of the night in stage N1 sleep, 20.73% in stage N2 sleep, 28.19% in stage N3 and 49.05% in REM.  CARDIAC DATA The 2 lead EKG demonstrated sinus rhythm. The mean heart rate was 67.14 beats per minute. Other EKG findings include: Rare PVC  LEG MOVEMENT DATA The total Periodic Limb Movements of Sleep (PLMS) were 46. The PLMS index was 8.66 .  IMPRESSIONS Severe obstructive sleep apnea occurred during the diagnostic portion of the study (AHI = 66.9/hour). An optimal PAP pressure was selected for this patient ( 8 cm of water) No significant central sleep apnea occurred during the diagnostic portion of the study (CAI = 0.0/hour). Severe oxygen desaturation was noted during the diagnostic portion of the study (Min O2 = 68.00%). The patient snored with Loud snoring volume during the diagnostic portion of the study. A rare PVC was noted during this study. Clinically significant periodic limb movements did not occur during sleep.  DIAGNOSIS Obstructive Sleep Apnea (327.23 [G47.33 ICD-10])  RECOMMENDATATIONS The patient should be counseled on proper sleep hygiene, avoidance of sleeping in the supine position and avoidance of alcohol within 4 hours of bedtime.  The patient was also instructed to avoid driving if sleepy.   Trial of CPAP therapy  on 8 cm H2O with a Medium size [employer]kel Full Face Mask Simplus mask and heated humidification. Avoid alcohol, sedatives and other CNS depressants that may worsen sleep apnea and disrupt  normal sleep architecture. Sleep hygiene should be reviewed to assess factors that may improve sleep quality. Weight management and regular exercise should be initiated or continued. Return to Sleep provider for re-evaluation after 10 weeks of therapy  Signed: Fransico Him, MD ABSM Diplomate, American Board of Sleep Medicine 10/21/2014

## 2014-10-21 NOTE — Telephone Encounter (Signed)
Please let patient know that they have significant sleep apnea and had successful CPAP titration and will be set up with CPAP unit.  Please let DME know that order is in EPIC.  Please set patient up for OV in 10 weeks 

## 2014-10-21 NOTE — Addendum Note (Signed)
Addended by: Sueanne Margarita on: 10/21/2014 07:46 PM   Modules accepted: Orders

## 2014-10-22 NOTE — Telephone Encounter (Signed)
LM on private vm (per DPR) explaining that he does have sleep apnea.  I told him if he wanted to know a little more in depth that he could call me.  I also let him know that [facility] should be contacting him within a week.  [facility] has been notified of orders.  Once he receives machine, I will schedule 10 week follow-up.

## 2014-12-14 ENCOUNTER — Encounter: Payer: Self-pay | Admitting: Cardiology

## 2015-02-04 ENCOUNTER — Other Ambulatory Visit: Payer: Self-pay

## 2015-02-04 ENCOUNTER — Ambulatory Visit: Payer: BLUE CROSS/BLUE SHIELD | Admitting: Cardiology

## 2015-02-04 DIAGNOSIS — R0989 Other specified symptoms and signs involving the circulatory and respiratory systems: Secondary | ICD-10-CM

## 2015-02-05 ENCOUNTER — Other Ambulatory Visit: Payer: Self-pay | Admitting: Cardiology

## 2015-02-10 ENCOUNTER — Encounter: Payer: Self-pay | Admitting: Cardiology

## 2015-07-08 ENCOUNTER — Other Ambulatory Visit: Payer: Self-pay | Admitting: Cardiology

## 2015-08-15 ENCOUNTER — Ambulatory Visit (INDEPENDENT_AMBULATORY_CARE_PROVIDER_SITE_OTHER): Payer: BLUE CROSS/BLUE SHIELD | Admitting: Family Medicine

## 2015-08-15 ENCOUNTER — Encounter: Payer: Self-pay | Admitting: Family Medicine

## 2015-08-15 ENCOUNTER — Ambulatory Visit (INDEPENDENT_AMBULATORY_CARE_PROVIDER_SITE_OTHER)
Admission: RE | Admit: 2015-08-15 | Discharge: 2015-08-15 | Disposition: A | Discharge status: Home / Self Care | Payer: BLUE CROSS/BLUE SHIELD | Source: Ambulatory Visit | Attending: Family Medicine | Admitting: Family Medicine

## 2015-08-15 VITALS — BP 120/60 | HR 79 | Temp 98.3°F | Ht 76.0 in | Wt 219.5 lb

## 2015-08-15 DIAGNOSIS — M7542 Impingement syndrome of left shoulder: Secondary | ICD-10-CM

## 2015-08-15 DIAGNOSIS — M25812 Other specified joint disorders, left shoulder: Secondary | ICD-10-CM

## 2015-08-15 DIAGNOSIS — M7582 Other shoulder lesions, left shoulder: Secondary | ICD-10-CM

## 2015-08-15 DIAGNOSIS — M25512 Pain in left shoulder: Secondary | ICD-10-CM

## 2015-08-15 MED ORDER — METHYLPREDNISOLONE ACETATE 40 MG/ML IJ SUSP
80.0000 mg | Freq: Once | INTRAMUSCULAR | Status: AC
  Administered 2015-08-15: 80 mg via INTRA_ARTICULAR

## 2015-08-15 NOTE — Progress Notes (Signed)
Pre visit review using our clinic review tool, if applicable. No additional management support is needed unless otherwise documented below in the visit note. 
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
## 2015-08-31 ENCOUNTER — Ambulatory Visit: Payer: BLUE CROSS/BLUE SHIELD | Attending: Family Medicine

## 2015-08-31 DIAGNOSIS — R293 Abnormal posture: Secondary | ICD-10-CM | POA: Insufficient documentation

## 2015-08-31 DIAGNOSIS — M25512 Pain in left shoulder: Secondary | ICD-10-CM | POA: Diagnosis present
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
## 2015-09-05 ENCOUNTER — Ambulatory Visit: Payer: BLUE CROSS/BLUE SHIELD

## 2015-09-05 DIAGNOSIS — M25512 Pain in left shoulder: Secondary | ICD-10-CM

## 2015-09-05 DIAGNOSIS — R293 Abnormal posture: Secondary | ICD-10-CM

## 2015-09-05 NOTE — Patient Instructions (Signed)
[email or web address]: Prone YTs 2 x 10 Wall slide with lift off 2 x 10 Shoulder ER with band 2 x 10 Chest press with + 8lbs 2 x 10
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
## 2015-10-03 ENCOUNTER — Encounter: Payer: Self-pay | Admitting: Family Medicine

## 2015-10-03 ENCOUNTER — Ambulatory Visit (INDEPENDENT_AMBULATORY_CARE_PROVIDER_SITE_OTHER): Payer: BLUE CROSS/BLUE SHIELD | Admitting: Family Medicine

## 2015-10-03 VITALS — BP 118/68 | HR 78 | Temp 98.2°F | Ht 76.0 in | Wt 220.2 lb

## 2015-10-03 DIAGNOSIS — M7542 Impingement syndrome of left shoulder: Secondary | ICD-10-CM

## 2015-10-03 DIAGNOSIS — M7582 Other shoulder lesions, left shoulder: Secondary | ICD-10-CM

## 2015-10-03 NOTE — Progress Notes (Signed)
Pre visit review using our clinic review tool, if applicable. No additional management support is needed unless otherwise documented below in the visit note. 
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
## 2015-10-16 ENCOUNTER — Other Ambulatory Visit: Payer: Self-pay | Admitting: Cardiology

## 2015-10-17 NOTE — Telephone Encounter (Signed)
Rx request sent to pharmacy.  

## 2015-11-09 ENCOUNTER — Encounter: Payer: Self-pay | Admitting: *Deleted

## 2015-11-14 ENCOUNTER — Other Ambulatory Visit: Payer: Self-pay | Admitting: Cardiology

## 2015-11-23 ENCOUNTER — Ambulatory Visit (INDEPENDENT_AMBULATORY_CARE_PROVIDER_SITE_OTHER): Payer: BLUE CROSS/BLUE SHIELD | Admitting: Family Medicine

## 2015-11-23 ENCOUNTER — Encounter: Payer: Self-pay | Admitting: Family Medicine

## 2015-11-23 ENCOUNTER — Ambulatory Visit (INDEPENDENT_AMBULATORY_CARE_PROVIDER_SITE_OTHER)
Admission: RE | Admit: 2015-11-23 | Discharge: 2015-11-23 | Disposition: A | Discharge status: Home / Self Care | Payer: BLUE CROSS/BLUE SHIELD | Source: Ambulatory Visit | Attending: Family Medicine | Admitting: Family Medicine

## 2015-11-23 VITALS — BP 106/60 | HR 86 | Temp 98.5°F | Ht 76.0 in | Wt 220.0 lb

## 2015-11-23 DIAGNOSIS — M79661 Pain in right lower leg: Secondary | ICD-10-CM

## 2015-11-23 DIAGNOSIS — M79604 Pain in right leg: Secondary | ICD-10-CM | POA: Diagnosis not present

## 2015-11-23 DIAGNOSIS — M898X6 Other specified disorders of bone, lower leg: Secondary | ICD-10-CM

## 2015-11-23 MED ORDER — MELOXICAM 7.5 MG PO TABS: 7.5000 mg | ORAL_TABLET | Freq: Every day | ORAL | 2 refills | Status: DC
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
## 2015-11-23 NOTE — Progress Notes (Signed)
Pre visit review using our clinic review tool, if applicable. No additional management support is needed unless otherwise documented below in the visit note. 

## 2015-12-17 ENCOUNTER — Other Ambulatory Visit: Payer: Self-pay | Admitting: Cardiology

## 2016-01-29 ENCOUNTER — Other Ambulatory Visit: Payer: Self-pay | Admitting: Cardiology

## 2016-01-30 NOTE — Telephone Encounter (Signed)
Spoke with patient and he is willing to schedule ov but he thought he did not need to be seen by cardiology anymore.  Patient was seeing Dr Ellyn Hack in the [facility] office, will send message to schedulers at [facility] office to get patient scheduled there.

## 2016-01-31 ENCOUNTER — Ambulatory Visit: Payer: BLUE CROSS/BLUE SHIELD | Admitting: Internal Medicine

## 2016-01-31 NOTE — Progress Notes (Deleted)
Follow-up Outpatient Visit Date: 01/31/2016  Chief Complaint: Follow-up supraventricular tachycardia  HPI:  Omar Willis is a 55 y.o. year-old male with history of supraventricular tachycardia, type 2 diabetes mellitus, hypertension, hyperlipidemia, obstructive sleep apnea, and GERD who presents for follow-up of SVT. He was previously followed in our office by Dr. Ellyn Hack, who last saw the patient on 07/28/14.  --------------------------------------------------------------------------------------------------  Cardiovascular History & Procedures: Cardiovascular Problems:  Supraventricular tachycardia  Bradycardia (sinus pauses while asleep)  Risk Factors:  Hypertension, hyperlipidemia, diabetes mellitus, male gender, and age> 61  Cath/PCI:  None  CV Surgery:  None  EP Procedures and Devices:  Event monitor (05/2014): Results not available but reportedly showed at least 3 pauses up to 3-3.5 seconds with ventricular escape beats while asleep.  The patient was asymptomatic.  No sustained arrhythmias (including atrial fibrillation) were identified.  Non-Invasive Evaluation(s):  ***  Recent CV Pertinent Labs: Lab Results  Component Value Date   CHOL 210 (H) 02/26/2011   HDL 43.30 02/26/2011   LDLCALC 143 (H) 08/24/2010   LDLDIRECT 143.4 02/26/2011   TRIG 96.0 02/26/2011   CHOLHDL 5 02/26/2011   K 4.6 02/26/2011   BUN 16 02/26/2011   CREATININE 0.8 02/26/2011     Past medical and surgical history were reviewed and updated in EPIC.   Outpatient Encounter Prescriptions as of 01/31/2016  Medication Sig  . B-D UF III MINI PEN NEEDLES 31G X 5 MM MISC USE DAILY WITH LANTUS  . diltiazem (CARDIZEM) 120 MG tablet TAKE 1 TABLET BY MOUTH TWICE A DAY. NEED APPT FOR MORE REFILLS  . glucose blood test strip Check blood sugar two times a day or as needed.   . Insulin Glargine (TOUJEO SOLOSTAR) 300 UNIT/ML SOPN Inject 48 Units into the skin every morning.   . Lancets MISC Check  blood sugar two times a day.   . meloxicam (MOBIC) 7.5 MG tablet Take 1 tablet (7.5 mg total) by mouth daily.  . metFORMIN (GLUCOPHAGE) 500 MG tablet Take 500 mg by mouth 3 (three) times daily.  . traMADol (ULTRAM) 50 MG tablet Take 50 mg by mouth every 6 (six) hours as needed.   No facility-administered encounter medications on file as of 01/31/2016.     Allergies: Patient has no known allergies.  Social History   Social History  . Marital status: Married    Spouse name: N/A  . Number of children: 2  . 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 Smoking status: Former Smoker    Quit date: 05/31/2005  . Smokeless tobacco: Never Used  . Alcohol use 0.0 oz/week     Comment: Very rare  . Drug use: No  . Sexual activity: Not on file   Other Topics Concern  . Not on file   Social History Narrative   Wife recently diagnosed with  MS.  Stays active with yard work.  Likes fishing.  Archery in the past but limited due to shoulders.    Family History  Problem Relation Age of Onset  . Cancer Father     Lymphoma  . Hypertension Neg Hx   . Colon cancer Neg Hx   . Esophageal cancer Neg Hx   . Stomach cancer Neg Hx   . Rectal cancer Neg Hx   . Heart disease Mother     Review of Systems: A 12-system review of systems was performed and was negative except as noted  in the HPI.  --------------------------------------------------------------------------------------------------  Physical Exam: There were no vitals taken for this visit.  General:  *** HEENT: No conjunctival pallor or scleral icterus.  Moist mucous membranes.  OP clear. Neck: Supple without lymphadenopathy, thyromegaly, JVD, or HJR.  No carotid bruit. Lungs: Normal work of breathing.  Clear to auscultation bilaterally without wheezes or crackles. Heart: Regular rate and rhythm without murmurs, rubs, or gallops.  Non-displaced PMI. Abd: Bowel  sounds present.  Soft, NT/ND without hepatosplenomegaly Ext: No lower extremity edema.  Radial, PT, and DP pulses are 2+ bilaterally. Skin: warm and dry without rash  EKG:  ***  Lab Results  Component Value Date   WBC 6.1 02/26/2011   HGB 16.0 02/26/2011   HCT 47.0 02/26/2011   MCV 88.5 02/26/2011   PLT 223.0 02/26/2011    Lab Results  Component Value Date   NA 139 02/26/2011   K 4.6 02/26/2011   CL 106 02/26/2011   CO2 26 02/26/2011   BUN 16 02/26/2011   CREATININE 0.8 02/26/2011   GLUCOSE 122 (H) 02/26/2011   ALT 58 (H) 02/26/2011    Lab Results  Component Value Date   CHOL 210 (H) 02/26/2011   HDL 43.30 02/26/2011   LDLCALC 143 (H) 08/24/2010   LDLDIRECT 143.4 02/26/2011   TRIG 96.0 02/26/2011   CHOLHDL 5 02/26/2011    --------------------------------------------------------------------------------------------------  ASSESSMENT AND PLAN: Nelva Bush, MD 01/31/2016 1:56 PM

## 2016-02-03 ENCOUNTER — Ambulatory Visit: Payer: BLUE CROSS/BLUE SHIELD | Admitting: Cardiology

## 2016-03-07 ENCOUNTER — Other Ambulatory Visit: Payer: Self-pay | Admitting: Cardiology

## 2016-03-07 NOTE — Telephone Encounter (Signed)
Rx(s) sent to pharmacy electronically.  

## 2016-03-22 ENCOUNTER — Other Ambulatory Visit: Payer: Self-pay | Admitting: Cardiology

## 2016-03-22 NOTE — Telephone Encounter (Signed)
Rx request sent to pharmacy.  

## 2017-10-18 IMAGING — DX DG SHOULDER 2+V*L*
3 series · 3 of 3 positions shown · non-contrast
Comparison: None.

CLINICAL DATA: Left shoulder impingement/pain for 2 months. No
reported injury.

EXAM:
LEFT SHOULDER - 2+ VIEW

[shoulder axial]
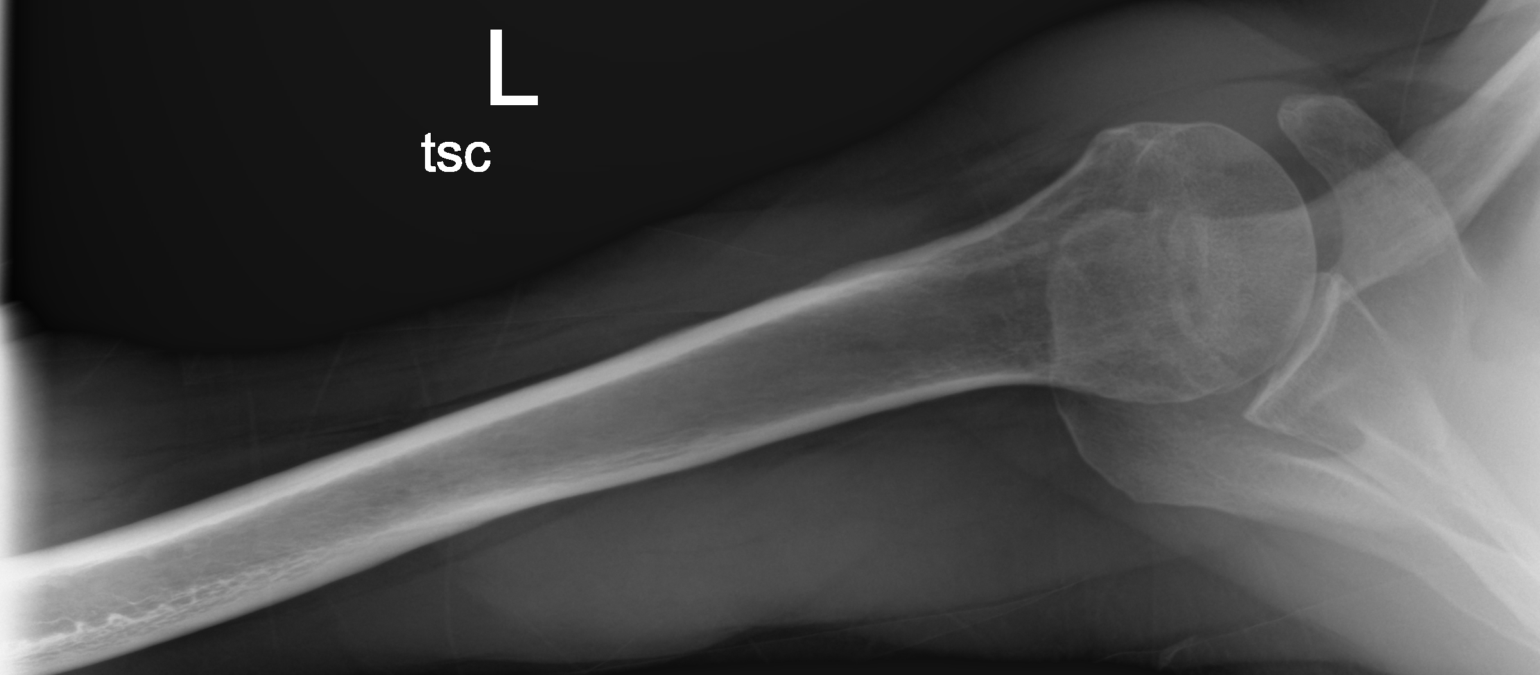

[shoulder ap]
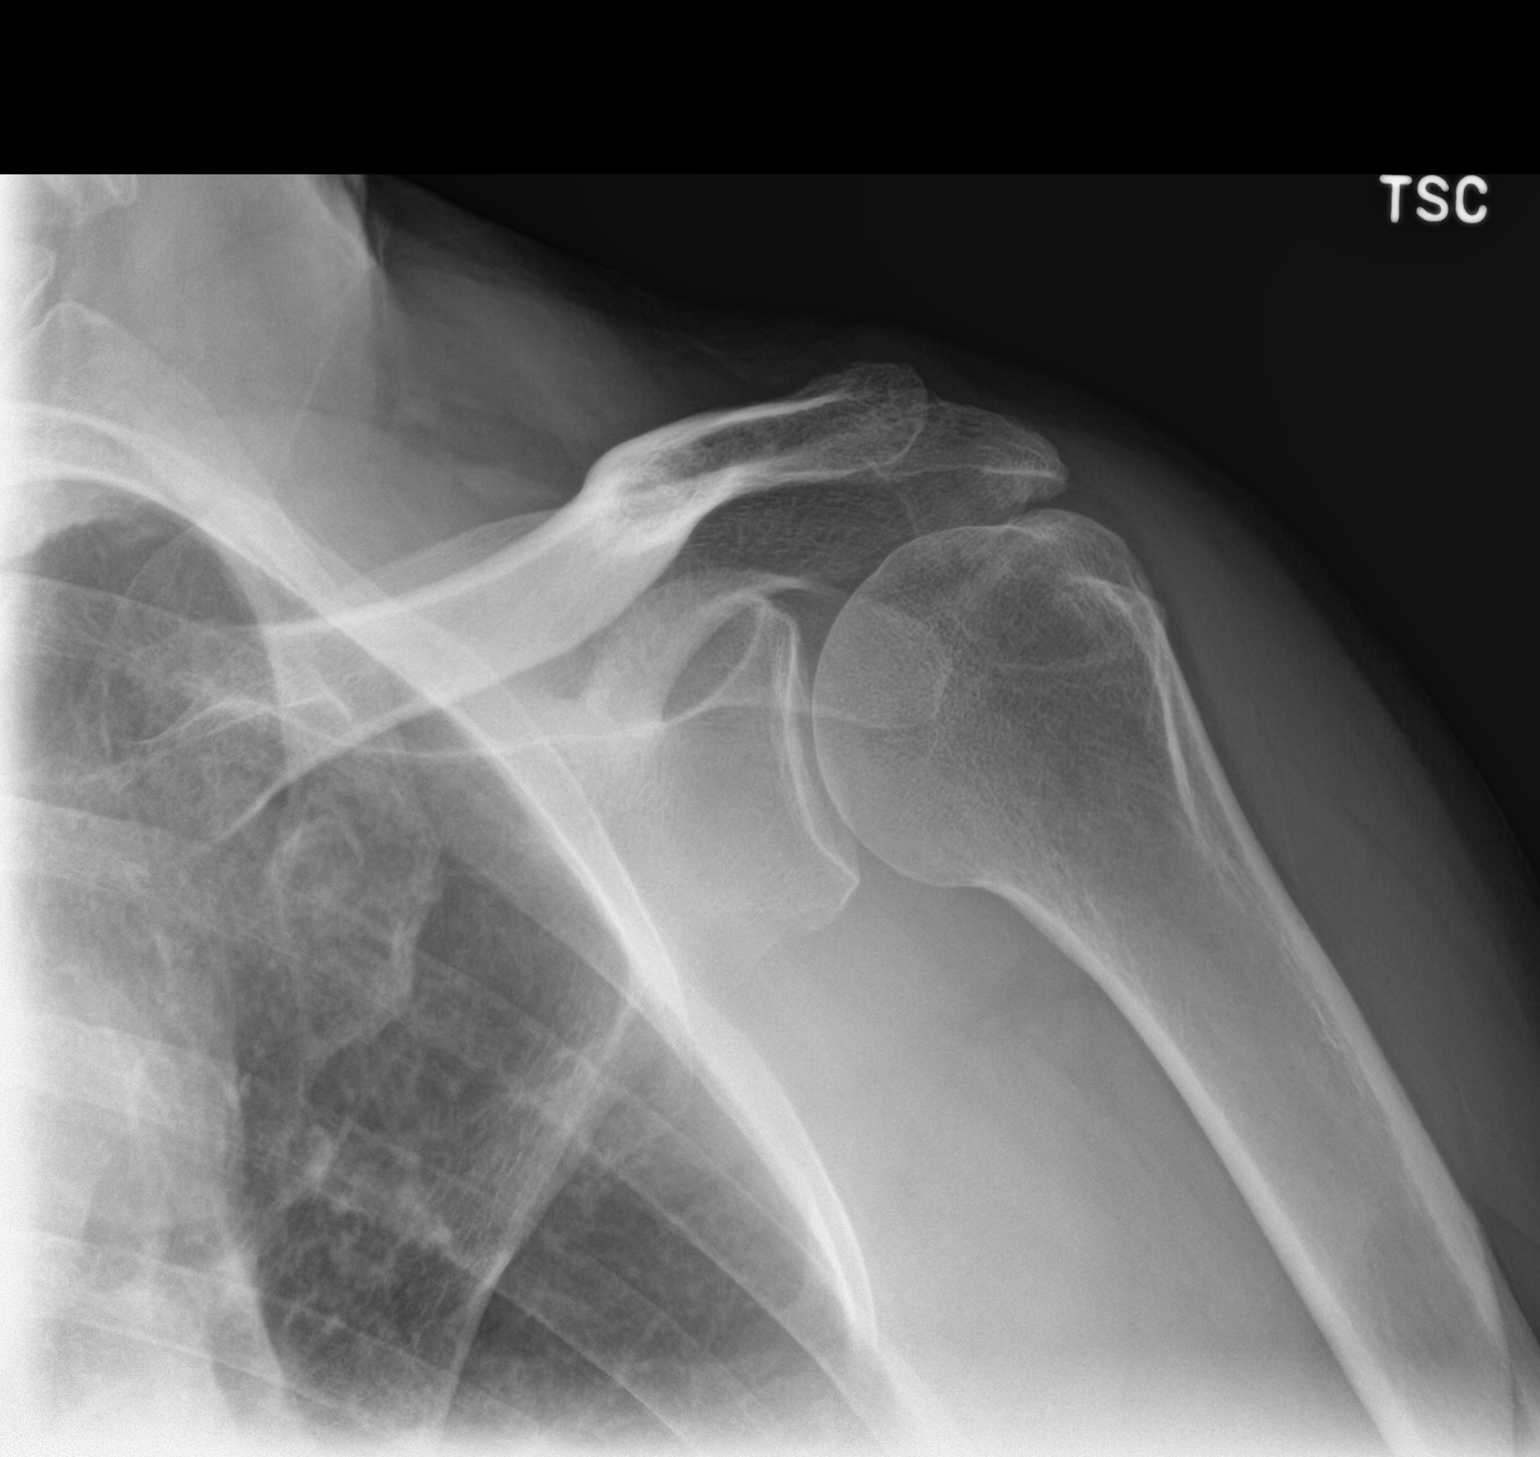

[shoulder y-view]
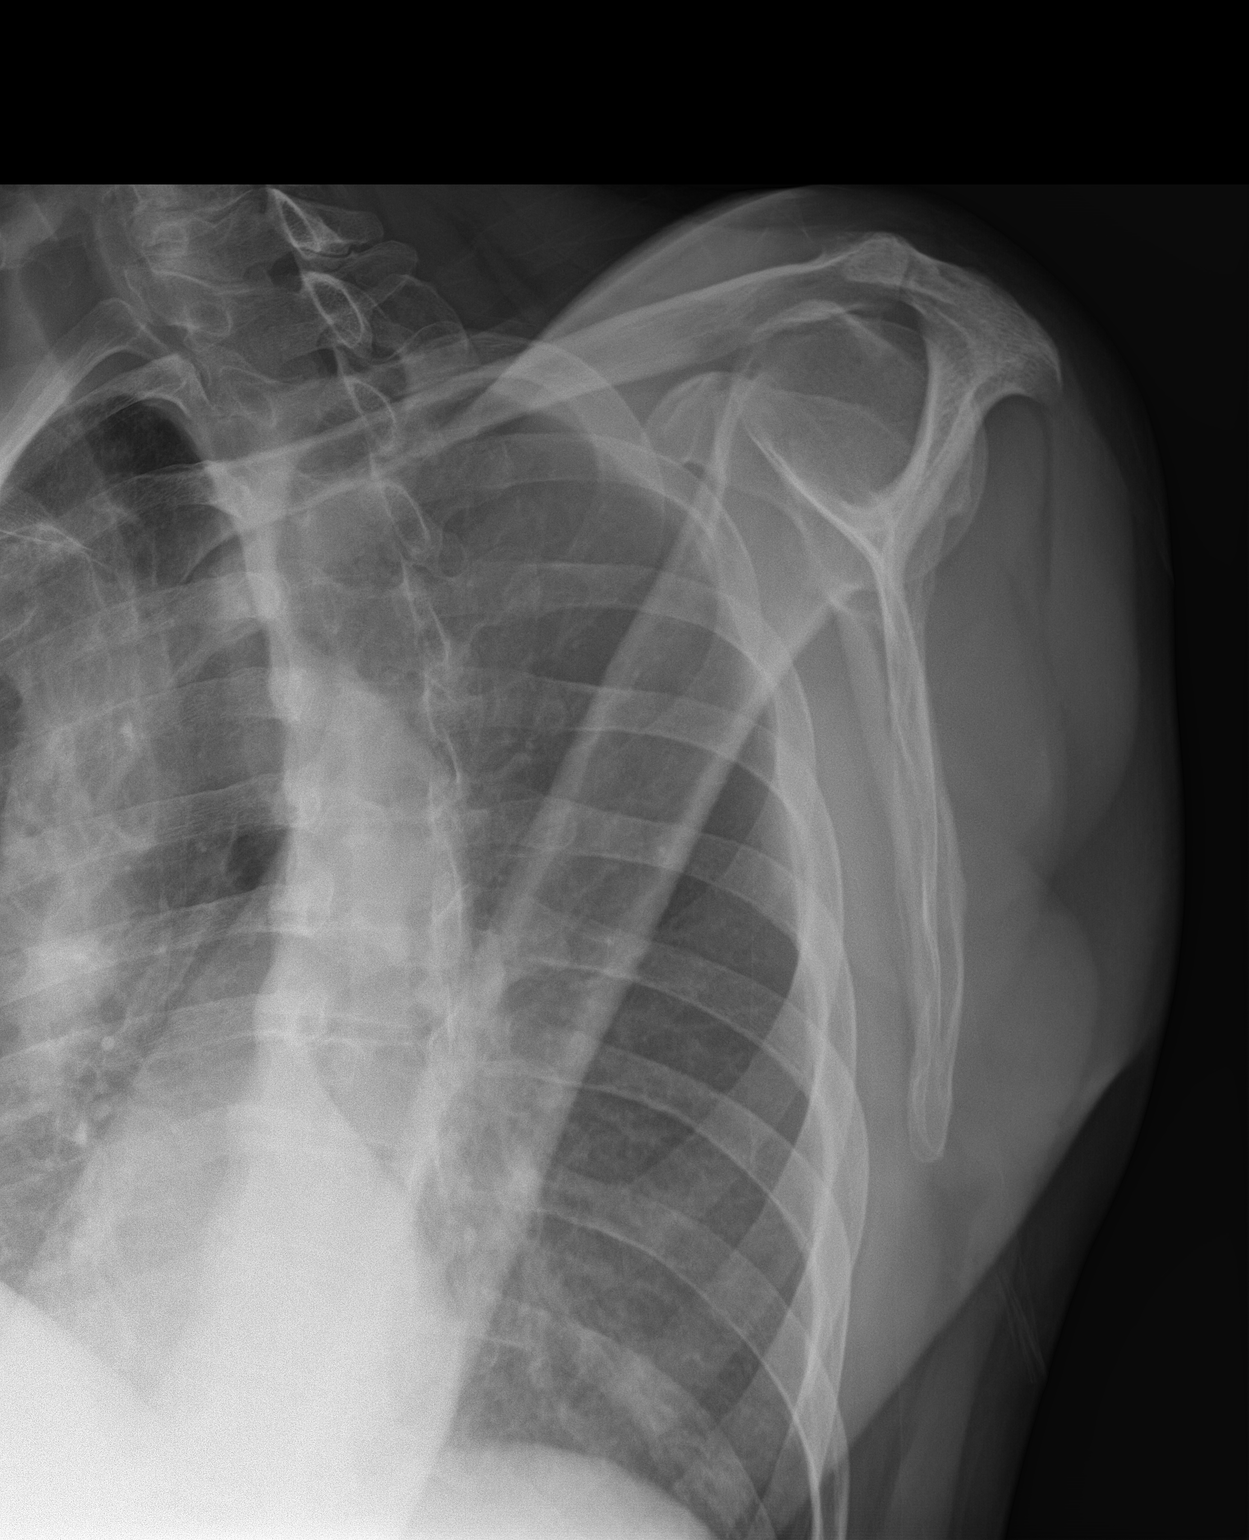

[3 of 3 positions shown; findings below may reference images not displayed]

FINDINGS: No fracture, dislocation, Hill-Sachs deformity or suspicious focal
osseous lesion. No appreciable degenerative or erosive arthropathy.
Slight remodeling of the undersurface of the left acromion with
narrowing of the space between the left acromion and left greater
tuberosity.
IMPRESSION: Slight remodeling of the undersurface of the left acromion with
narrowing of the space between the left acromion and left greater
tuberosity, radiographic findings consistent with the provided
history of left shoulder impingement .

## 2019-07-20 DIAGNOSIS — E1169 Type 2 diabetes mellitus with other specified complication: Secondary | ICD-10-CM | POA: Diagnosis not present

## 2019-07-20 DIAGNOSIS — I152 Hypertension secondary to endocrine disorders: Secondary | ICD-10-CM | POA: Diagnosis not present

## 2019-07-20 DIAGNOSIS — Z0000 Encounter for general adult medical examination without abnormal findings: Secondary | ICD-10-CM | POA: Diagnosis not present

## 2019-07-20 DIAGNOSIS — Z79899 Other long term (current) drug therapy: Secondary | ICD-10-CM | POA: Diagnosis not present

## 2019-07-20 DIAGNOSIS — E1159 Type 2 diabetes mellitus with other circulatory complications: Secondary | ICD-10-CM | POA: Diagnosis not present

## 2019-07-20 DIAGNOSIS — E119 Type 2 diabetes mellitus without complications: Secondary | ICD-10-CM | POA: Diagnosis not present

## 2019-08-21 DIAGNOSIS — E1165 Type 2 diabetes mellitus with hyperglycemia: Secondary | ICD-10-CM | POA: Diagnosis not present

## 2019-08-21 DIAGNOSIS — E1159 Type 2 diabetes mellitus with other circulatory complications: Secondary | ICD-10-CM | POA: Diagnosis not present

## 2019-08-21 DIAGNOSIS — E1169 Type 2 diabetes mellitus with other specified complication: Secondary | ICD-10-CM | POA: Diagnosis not present

## 2019-08-21 DIAGNOSIS — E1142 Type 2 diabetes mellitus with diabetic polyneuropathy: Secondary | ICD-10-CM | POA: Diagnosis not present

## 2019-11-20 DIAGNOSIS — E1165 Type 2 diabetes mellitus with hyperglycemia: Secondary | ICD-10-CM | POA: Diagnosis not present

## 2019-11-20 DIAGNOSIS — E1169 Type 2 diabetes mellitus with other specified complication: Secondary | ICD-10-CM | POA: Diagnosis not present

## 2019-11-20 DIAGNOSIS — E1142 Type 2 diabetes mellitus with diabetic polyneuropathy: Secondary | ICD-10-CM | POA: Diagnosis not present

## 2019-11-20 DIAGNOSIS — E1159 Type 2 diabetes mellitus with other circulatory complications: Secondary | ICD-10-CM | POA: Diagnosis not present

## 2020-01-01 DIAGNOSIS — S0502XA Injury of conjunctiva and corneal abrasion without foreign body, left eye, initial encounter: Secondary | ICD-10-CM | POA: Diagnosis not present

## 2020-01-25 DIAGNOSIS — Z79899 Other long term (current) drug therapy: Secondary | ICD-10-CM | POA: Diagnosis not present

## 2020-01-25 DIAGNOSIS — E1159 Type 2 diabetes mellitus with other circulatory complications: Secondary | ICD-10-CM | POA: Diagnosis not present

## 2020-01-25 DIAGNOSIS — E119 Type 2 diabetes mellitus without complications: Secondary | ICD-10-CM | POA: Diagnosis not present

## 2020-01-25 DIAGNOSIS — E1169 Type 2 diabetes mellitus with other specified complication: Secondary | ICD-10-CM | POA: Diagnosis not present

## 2020-02-01 DIAGNOSIS — H40023 Open angle with borderline findings, high risk, bilateral: Secondary | ICD-10-CM | POA: Diagnosis not present

## 2020-02-29 DIAGNOSIS — E1169 Type 2 diabetes mellitus with other specified complication: Secondary | ICD-10-CM | POA: Diagnosis not present

## 2020-02-29 DIAGNOSIS — E785 Hyperlipidemia, unspecified: Secondary | ICD-10-CM | POA: Diagnosis not present

## 2020-02-29 DIAGNOSIS — E1165 Type 2 diabetes mellitus with hyperglycemia: Secondary | ICD-10-CM | POA: Diagnosis not present

## 2020-04-29 DIAGNOSIS — E1159 Type 2 diabetes mellitus with other circulatory complications: Secondary | ICD-10-CM | POA: Diagnosis not present

## 2020-04-29 DIAGNOSIS — E1165 Type 2 diabetes mellitus with hyperglycemia: Secondary | ICD-10-CM | POA: Diagnosis not present

## 2020-04-29 DIAGNOSIS — E1142 Type 2 diabetes mellitus with diabetic polyneuropathy: Secondary | ICD-10-CM | POA: Diagnosis not present

## 2020-04-29 DIAGNOSIS — E1169 Type 2 diabetes mellitus with other specified complication: Secondary | ICD-10-CM | POA: Diagnosis not present

## 2020-07-29 DIAGNOSIS — E1169 Type 2 diabetes mellitus with other specified complication: Secondary | ICD-10-CM | POA: Diagnosis not present

## 2020-07-29 DIAGNOSIS — Z0000 Encounter for general adult medical examination without abnormal findings: Secondary | ICD-10-CM | POA: Diagnosis not present

## 2020-07-29 DIAGNOSIS — E1165 Type 2 diabetes mellitus with hyperglycemia: Secondary | ICD-10-CM | POA: Diagnosis not present

## 2020-07-29 DIAGNOSIS — E1159 Type 2 diabetes mellitus with other circulatory complications: Secondary | ICD-10-CM | POA: Diagnosis not present

## 2020-07-29 DIAGNOSIS — E1142 Type 2 diabetes mellitus with diabetic polyneuropathy: Secondary | ICD-10-CM | POA: Diagnosis not present

## 2021-01-27 LAB — HEMOGLOBIN A1C: Hemoglobin A1C: 6.8

## 2021-04-27 ENCOUNTER — Encounter: Payer: Self-pay | Admitting: Gastroenterology

## 2021-04-28 ENCOUNTER — Ambulatory Visit: Payer: BC Managed Care – PPO | Admitting: Nurse Practitioner

## 2021-04-28 ENCOUNTER — Other Ambulatory Visit: Payer: Self-pay

## 2021-04-28 ENCOUNTER — Telehealth: Payer: Self-pay | Admitting: Nurse Practitioner

## 2021-04-28 ENCOUNTER — Encounter: Payer: Self-pay | Admitting: Nurse Practitioner

## 2021-04-28 VITALS — BP 122/64 | HR 80 | Temp 97.4°F | Resp 12 | Ht 74.5 in | Wt 205.5 lb

## 2021-04-28 DIAGNOSIS — K4090 Unilateral inguinal hernia, without obstruction or gangrene, not specified as recurrent: Secondary | ICD-10-CM | POA: Diagnosis not present

## 2021-04-28 DIAGNOSIS — I10 Essential (primary) hypertension: Secondary | ICD-10-CM

## 2021-04-28 DIAGNOSIS — E119 Type 2 diabetes mellitus without complications: Secondary | ICD-10-CM | POA: Diagnosis not present

## 2021-04-28 DIAGNOSIS — Z122 Encounter for screening for malignant neoplasm of respiratory organs: Secondary | ICD-10-CM

## 2021-04-28 DIAGNOSIS — Z1211 Encounter for screening for malignant neoplasm of colon: Secondary | ICD-10-CM | POA: Diagnosis not present

## 2021-04-28 DIAGNOSIS — E785 Hyperlipidemia, unspecified: Secondary | ICD-10-CM | POA: Diagnosis not present

## 2021-04-28 DIAGNOSIS — Z0000 Encounter for general adult medical examination without abnormal findings: Secondary | ICD-10-CM | POA: Diagnosis not present

## 2021-04-28 DIAGNOSIS — G4733 Obstructive sleep apnea (adult) (pediatric): Secondary | ICD-10-CM

## 2021-04-28 NOTE — Assessment & Plan Note (Signed)
Last A1c was 6.8.  Patient does have Dexcom 6 and checks glucose frequently.  Works on diet and exercise and currently maintained on metformin daily.  Pending lab results ?

## 2021-04-28 NOTE — Telephone Encounter (Signed)
Can we verify that he take 3-500mg  tablets of metformin twice a day. If he is taking that much that is 3000mg  the normal top dose is 2000mg . So I would recommend 2- 500mg  tablets twice daily for a total of 2000mg  daily ? ?Thanks, ?Matt  ?
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
## 2021-04-28 NOTE — Assessment & Plan Note (Signed)
Scrotal hernia appreciated on exam.  Ambulatory referral to general surgery per patient request.  Signs and symptoms reviewed when to seek urgent or emergent health care in regards to hernia ?

## 2021-04-28 NOTE — Assessment & Plan Note (Signed)
Patient currently maintained with no medication managed with diet and exercise.  Does check his blood pressure weekly continued. ?

## 2021-04-28 NOTE — Assessment & Plan Note (Signed)
Discussed age-appropriate immunizations and screening exams.  Appropriate orders placed ?

## 2021-04-28 NOTE — Assessment & Plan Note (Signed)
Historical diagnosis patient has lost good amount of weight.  Patient has not been using CPAP.  States he no longer snores and does feel rested when he wakes in the morning. ?

## 2021-04-28 NOTE — Patient Instructions (Signed)
Nice to see you today ?Follow up with me in 3 months for recheck ?I will be in touch with the labs ? ?

## 2021-04-28 NOTE — Assessment & Plan Note (Signed)
Patient's last LDL was 101.  Patient not currently on a statin therapy.  Did discuss that we use statins for preventative therapy in patients with diabetes and cholesterol.  Patient will think about going on a statin therapy.  Pending lab results ?

## 2021-04-29 LAB — MICROALBUMIN / CREATININE URINE RATIO
Creatinine, Urine: 22 mg/dL (ref 20–320)
Microalb Creat Ratio: 18 mcg/mg creat (ref <30)
Microalb, Ur: 0.4 mg/dL

## 2021-05-01 NOTE — Telephone Encounter (Signed)
Not at this time.

## 2021-05-01 NOTE — Telephone Encounter (Signed)
Spoke with patient. Patient apologized and said he actually takes 2 tablets twice daily not 3. I updated his chart to reflect that ?

## 2021-05-05 DIAGNOSIS — H40013 Open angle with borderline findings, low risk, bilateral: Secondary | ICD-10-CM | POA: Diagnosis not present

## 2021-05-08 ENCOUNTER — Other Ambulatory Visit (INDEPENDENT_AMBULATORY_CARE_PROVIDER_SITE_OTHER): Payer: BC Managed Care – PPO

## 2021-05-08 ENCOUNTER — Other Ambulatory Visit: Payer: Self-pay

## 2021-05-08 DIAGNOSIS — E119 Type 2 diabetes mellitus without complications: Secondary | ICD-10-CM

## 2021-05-08 DIAGNOSIS — Z0000 Encounter for general adult medical examination without abnormal findings: Secondary | ICD-10-CM | POA: Diagnosis not present

## 2021-05-08 DIAGNOSIS — Z1211 Encounter for screening for malignant neoplasm of colon: Secondary | ICD-10-CM

## 2021-05-08 DIAGNOSIS — Z125 Encounter for screening for malignant neoplasm of prostate: Secondary | ICD-10-CM | POA: Diagnosis not present

## 2021-05-08 DIAGNOSIS — E785 Hyperlipidemia, unspecified: Secondary | ICD-10-CM

## 2021-05-08 LAB — CBC
HCT: 44.4 % (ref 39.0–52.0)
Hemoglobin: 15 g/dL (ref 13.0–17.0)
MCHC: 33.8 g/dL (ref 30.0–36.0)
MCV: 87.6 fl (ref 78.0–100.0)
Platelets: 216 10*3/uL (ref 150.0–400.0)
RBC: 5.07 Mil/uL (ref 4.22–5.81)
RDW: 13.2 % (ref 11.5–15.5)
WBC: 5 10*3/uL (ref 4.0–10.5)

## 2021-05-08 LAB — HEMOGLOBIN A1C: Hgb A1c MFr Bld: 7.2 % — ABNORMAL HIGH (ref 4.6–6.5)

## 2021-05-08 LAB — COMPREHENSIVE METABOLIC PANEL
ALT: 39 U/L (ref 0–53)
AST: 26 U/L (ref 0–37)
Albumin: 4.7 g/dL (ref 3.5–5.2)
Alkaline Phosphatase: 59 U/L (ref 39–117)
BUN: 24 mg/dL — ABNORMAL HIGH (ref 6–23)
CO2: 25 mEq/L (ref 19–32)
Calcium: 9.6 mg/dL (ref 8.4–10.5)
Chloride: 102 mEq/L (ref 96–112)
Creatinine, Ser: 0.92 mg/dL (ref 0.40–1.50)
GFR: 89.44 mL/min (ref >60.00)
Glucose, Bld: 148 mg/dL — ABNORMAL HIGH (ref 70–99)
Potassium: 4.6 mEq/L (ref 3.5–5.1)
Sodium: 136 mEq/L (ref 135–145)
Total Bilirubin: 1.1 mg/dL (ref 0.2–1.2)
Total Protein: 7 g/dL (ref 6.0–8.3)

## 2021-05-08 LAB — LIPID PANEL
Cholesterol: 173 mg/dL (ref 0–200)
HDL: 40.4 mg/dL (ref >39.00)
LDL Cholesterol: 112 mg/dL — ABNORMAL HIGH (ref 0–99)
NonHDL: 132.19
Total CHOL/HDL Ratio: 4
Triglycerides: 99 mg/dL (ref 0.0–149.0)
VLDL: 19.8 mg/dL (ref 0.0–40.0)

## 2021-05-08 LAB — PSA: PSA: 0.16 ng/mL (ref 0.10–4.00)

## 2021-05-10 ENCOUNTER — Telehealth: Payer: Self-pay | Admitting: Nurse Practitioner

## 2021-05-10 NOTE — Telephone Encounter (Signed)
-----   Message from [place] sent at 05/10/2021  4:15 PM EDT ----- ?Patient advised. 1) patient states he had bad muscle/joint pain when on statin-I asked patient if it was to just rosuvastatin and atorvastatin that is in the chart or another one too, patient could not remember. Also per past medications shows patient use to take Niacin too. 2) patient was not happy with his A1C, he does not understand why it is high when it was better 3 months ago. Patient states he has actually done better on cutting out sugar and carbs and working out. He will reach out to the Health program he follows to re do his blood work and if A1C is still up will try to figure out the reason. ?

## 2021-05-10 NOTE — Telephone Encounter (Signed)
If patient is intolerant to statins we can use zetia to help with the cholesterol management. There is also pravastatin that is the least likely to cause the muscle aches and pains. Not sure why the change in the A1C as we just met. That is why we check it ever three months to make sure it stays on the right track.  ? ?Is he ok to try pravastatin?  ? ? ?

## 2021-05-11 ENCOUNTER — Other Ambulatory Visit: Payer: Self-pay | Admitting: Nurse Practitioner

## 2021-05-11 MED ORDER — PRAVASTATIN SODIUM 10 MG PO TABS: 10.0000 mg | ORAL_TABLET | Freq: Every day | ORAL | 1 refills | Status: DC

## 2021-05-11 NOTE — Telephone Encounter (Signed)
Medication sent.

## 2021-05-11 NOTE — Telephone Encounter (Signed)
Patient was spoken to about the pravastatin and he stated he would like to try  that medication and [facility] can [facility] [place].  Keshav Winegar,cma  ?

## 2021-05-26 DIAGNOSIS — K4090 Unilateral inguinal hernia, without obstruction or gangrene, not specified as recurrent: Secondary | ICD-10-CM | POA: Diagnosis not present

## 2021-06-14 ENCOUNTER — Telehealth: Payer: Self-pay

## 2021-06-14 NOTE — Telephone Encounter (Signed)
Patient advised. Patient states he will hold off on the chest xray and discuss if needed in June as scheduled ?

## 2021-06-14 NOTE — Telephone Encounter (Signed)
Since patient is ineligibility he is requesting a CT chest w/o contrast. I do not have a reason to purse a CT chest. We can do a chest xray and if that shows anything concerning then we would have grounds to get a CT scan ?

## 2021-06-14 NOTE — Telephone Encounter (Signed)
Spoke with patient by phone regarding [facility] referral by Karl Ito, NP.  Patient confirmed he has not smoked since 2007, which would make him ineligible for [facility] LDCT as he is over 15 years non-smoker.  Advised provider will be updated on ineligibility and option would be for CT chest wo contrast, if recommended by provider.  Patient is interested in having the Chest CT, if provider feels it is in his best interest.  Patient acknowledged understanding and had no further questions.  This note routed to PCP for review.   ?

## 2021-06-15 ENCOUNTER — Telehealth: Payer: Self-pay

## 2021-06-15 NOTE — Telephone Encounter (Signed)
Patient states he has received eye drops RX , and has started this now. Patient will keep [place] posted as needed if symptoms do not improve ?

## 2021-06-15 NOTE — Telephone Encounter (Signed)
I do not see where eye drops were sent into unless the on call service can do that. Can we verify and make sure please ?

## 2021-06-15 NOTE — Telephone Encounter (Signed)
[facility] - Client ?TELEPHONE ADVICE RECORD ?AccessNurse? ?Patient ?Name: ?Omar T ?Willis ?Gender: Male ?DOB: 1960/10/05 ?Age: 61 Y 1 M 30 D ?Return ?Phone ?Number: ?[id number] ?(Primary) ?Address: ?City/ ?State/ ?Zip: ?[place] ? [address] ?Client [facility] - Client ?Client Site [place] ?Provider Romilda Garret- NP ?Contact Type Call ?Who Is Calling Patient / Member / Family / Caregiver ?Call Type Triage / Clinical ?Relationship To Patient Self ?Return Phone Number [phone] (Primary) ?Chief Complaint Eye Redness ?Reason for Call Symptomatic / Request for Health Information ?Initial Comment Caller states eye began bothering them yesterday. ?Looks like pink eye. Would like prescription ?Translation No ?Nurse Assessment ?Nurse: Gildardo Pounds, RN, Amy Date/Time Eilene Ghazi Time): 06/15/2021 9:15:52 AM ?Confirm and document reason for call. If ?symptomatic, describe symptoms. ?---Caller states his eye began bothering him yesterday ?morning. It was irritated like an eyelash was in his eye. ?It got worse last night. When he woke up his eye was ?crusted shut. It looks like pink eye. He would like a ?prescription for it. Both eyes are red. They itch. He has ?contacts in. ?Does the patient have any new or worsening ?symptoms? ---Yes ?Will a triage be completed? ---Yes ?Related visit to physician within the last 2 weeks? ---No ?Does the PT have any chronic conditions? (i.e. ?diabetes, asthma, this includes High risk factors for ?pregnancy, etc.) ?---No ?Is this a behavioral health or substance abuse call? ---No ?Guidelines ?Guideline Title Affirmed Question Affirmed Notes Nurse Date/Time (Eastern ?Time) ?Eye - Pus or ?Discharge ?[1] Eye with yellow ?or green discharge, ?or eyelashes stick ?together AND [2] ?PCP standing order to ?call in antibiotic eye ?drops ?[place], [place], [place] 06/15/2021 9:18:54 ?AM ?PLEASE NOTE: All timestamps  contained within this report are represented as [place] Standard Time. ?CONFIDENTIALTY NOTICE: This fax transmission is intended only for the addressee. It contains information that is legally privileged, confidential or ?otherwise protected from use or disclosure. If you are not the intended recipient, you are strictly prohibited from reviewing, disclosing, copying using ?or disseminating any of this information or taking any action in reliance on or regarding this information. If you have received this fax in error, please ?notify us immediately by telephone so that we can arrange for its return to [place]. Phone: [phone], Toll-Free: [phone], Fax: [phone] ?Page: 2 of 3 ?Call Id: [id number] ?Disp. Time (Eastern ?Time) Disposition Final User ?06/15/2021 9:27:17 AM Pharmacy Call Lovelace, RN, Amy ?Reason: Called SO prescription to the ?pharmacy requested. ?06/15/2021 9:22:34 AM Home Care Yes Lovelace, RN, Amy ?Caller Disagree/Comply Comply ?Caller Understands Yes ?PreDisposition InappropriateToAsk ?Care Advice Given Per Guideline ?HOME CARE: * You should be able to treat this at home. * Here is some care advice that should help. EYELID CLEANSING: * ?Gently wash eyelids and lashes with warm water and wet cotton balls (or cotton gauze). Remove all the dried and liquid pus. * Do ?this as often as needed. NOTE TO TRIAGER - PRESCRIPTION OPTION FOR ANTIBIOTIC EYEDROPS PER PROTOCOL - ?[place]: * If PCP approves calling in prescription, do so per protocol. * Prescription: Polytrim (polymyxin-trimethoprim) ?eyedrops, 5 ml bottle ($12) CONTAGIOUSNESS: * [place] is contagious. Try not to touch your eyes. Wash your hands frequently. ?Do not share towels. REMOVE CONTACTS: * Patients with contact lenses need to switch to glasses temporarily. Reason: To ?prevent damage to the cornea. * Clean the contacts before wearing them again (or discard them if disposable). EXPECTED ?COURSE: * With treatment, the yellow  discharge  should clear up in 3 days. * The red eyes may persist for several more days. CALL ?BACK IF: * Pus lasts over 3 days (72 hours) on treatment * Blurred vision occurs * Light bothers your eyes * More than just mild ?eye discomfort * You become worse CARE ADVICE given per Eye - Pus or Discharge (Adult) guideline. ?Standing Orders ?Preparation Additional ?Instructions Route FrequencyDuration Nurse Comments User Name ?Polytrim Eye ?Drops 2 drops ?both eyes ?Eye ?Four ?Times ?Daily ?5 Days Lovelace, Therapist, sports, ?Amy ?Comments ?User: Wayne Sever, RN Date/Time Eilene Ghazi Time): 06/15/2021 9:23:45 AM ?[facility] [place] [facility] [phone] ?

## 2021-06-15 NOTE — Telephone Encounter (Signed)
Appears per access note polytrim eye drops were sent in to pharmacy for pt. Sending note to Romilda Garret NP and Anastasiya CMA. ?

## 2021-06-20 ENCOUNTER — Other Ambulatory Visit: Payer: Self-pay | Admitting: Nurse Practitioner

## 2021-06-20 ENCOUNTER — Other Ambulatory Visit: Payer: Self-pay

## 2021-06-20 MED ORDER — DEXCOM G6 SENSOR MISC
1.0000 "application " | 3 refills | Status: DC
  Filled 2021-06-20: qty 3, 30d supply, fill #0

## 2021-06-23 ENCOUNTER — Other Ambulatory Visit: Payer: Self-pay

## 2021-07-20 DIAGNOSIS — K4090 Unilateral inguinal hernia, without obstruction or gangrene, not specified as recurrent: Secondary | ICD-10-CM | POA: Diagnosis not present

## 2021-07-20 DIAGNOSIS — G8918 Other acute postprocedural pain: Secondary | ICD-10-CM | POA: Diagnosis not present

## 2021-07-28 ENCOUNTER — Other Ambulatory Visit: Payer: Self-pay

## 2021-07-28 MED ORDER — DEXCOM G6 TRANSMITTER MISC
1.0000 | 1 refills | Status: DC
Start: 2021-07-28 — End: 2021-10-25

## 2021-07-28 NOTE — Telephone Encounter (Signed)
Spoke with patient. Updated pharmacy [facility]. Patient states he does need a RX for Dexcom G6 transmitter also- I pulled this in for review-for 90 day supply for Transmitter it is 1 transmitter for 90 days supply-per pharmacist. [facility] and [facility] do [facility] for Sensor part and will get this filled. They combined the refills that were sent over for this as a 30 day originally and will fill it for 90 days supply for convenience.

## 2021-07-28 NOTE — Telephone Encounter (Signed)
MEDICATION: Continuous Blood Gluc Sensor (DEXCOM G6 SENSOR) MISC //    [facility] [address] [place], [place] [address]  Comments: Patient would like this to be the preferred pharmacy.   **Let patient know to contact pharmacy at the end of the day to make sure medication is ready. **  ** Please notify patient to allow 48-72 hours to process**  **Encourage patient to contact the pharmacy for refills or they can request refills through [facility]**

## 2021-08-04 ENCOUNTER — Ambulatory Visit: Payer: BC Managed Care – PPO | Admitting: Nurse Practitioner

## 2021-08-11 ENCOUNTER — Ambulatory Visit: Payer: BC Managed Care – PPO | Admitting: Nurse Practitioner

## 2021-08-11 ENCOUNTER — Encounter: Payer: Self-pay | Admitting: Nurse Practitioner

## 2021-08-11 VITALS — BP 122/64 | HR 82 | Temp 97.2°F | Ht 77.0 in | Wt 206.6 lb

## 2021-08-11 DIAGNOSIS — E1169 Type 2 diabetes mellitus with other specified complication: Secondary | ICD-10-CM | POA: Diagnosis not present

## 2021-08-11 DIAGNOSIS — I10 Essential (primary) hypertension: Secondary | ICD-10-CM

## 2021-08-11 DIAGNOSIS — E785 Hyperlipidemia, unspecified: Secondary | ICD-10-CM | POA: Diagnosis not present

## 2021-08-11 DIAGNOSIS — E119 Type 2 diabetes mellitus without complications: Secondary | ICD-10-CM | POA: Diagnosis not present

## 2021-08-11 LAB — POCT GLYCOSYLATED HEMOGLOBIN (HGB A1C)
Hemoglobin A1C: 7 % — AB (ref 4.0–5.6)
Hemoglobin A1C: 7 % — AB (ref 4.0–5.6)

## 2021-08-11 MED ORDER — JANUVIA 50 MG PO TABS: 50.0000 mg | ORAL_TABLET | Freq: Every day | ORAL | 1 refills | Status: DC

## 2021-08-11 NOTE — Progress Notes (Signed)
Established Patient Office Visit  Subjective   Patient ID: Omar Willis, male    DOB: [birth year]  Age: 61 y.o. MRN: [id number]  Chief Complaint  Patient presents with   Diabetes    Pt f/u A1c check      DM2: states that he was followed through a program at work.  Program recently and is in April.  Patient still using a Dexcom G6 to monitor glucoses.  Patient currently maintained on metformin 2000 mg daily and Januvia 50 mg once daily.    Hernia Surgery: 3 weeks out from left inguinal  has follow up on 08-24-2021. Has been doing well.  Currently on activity restrictions such as lifting and certain exercises.  HLD: Place patient on Pravastatin 10 mg in order to help patient's risk reduction.  He has not tolerated atorvastatin or rosuvastatin in the past he is tolerating this medication well currently.    Review of Systems  Constitutional:  Negative for chills and fever.  Respiratory:  Negative for shortness of breath.   Cardiovascular:  Negative for chest pain.  Neurological:  Negative for weakness.      Objective:     BP 122/64   Pulse 82   Temp (!) 97.2 F (36.2 C) (Temporal)   Ht '6\' 5"'$  (1.956 m)   Wt 206 lb 9.6 oz (93.7 kg)   SpO2 95%   BMI 24.50 kg/m    Physical Exam Vitals and nursing note reviewed.  Constitutional:      Appearance: Normal appearance.  Cardiovascular:     Rate and Rhythm: Normal rate and regular rhythm.     Heart sounds: Normal heart sounds.  Pulmonary:     Effort: Pulmonary effort is normal.     Breath sounds: Normal breath sounds.  Abdominal:     General: Bowel sounds are normal.  Lymphadenopathy:     Cervical: No cervical adenopathy.  Neurological:     Mental Status: He is alert.      Results for orders placed or performed in visit on 08/11/21  HgB A1c  Result Value Ref Range   Hemoglobin A1C 7.0 (A) 4.0 - 5.6 %   HbA1c POC (<> result, manual entry)     HbA1c, POC (prediabetic range)     HbA1c, POC (controlled  diabetic range)    POCT glycosylated hemoglobin (Hb A1C)  Result Value Ref Range   Hemoglobin A1C 7.0 (A) 4.0 - 5.6 %   HbA1c POC (<> result, manual entry)     HbA1c, POC (prediabetic range)     HbA1c, POC (controlled diabetic range)        The 10-year ASCVD risk score (Arnett DK, et al., 2019) is: 17.6%    Assessment & Plan:   Problem List Items Addressed This Visit       Cardiovascular and Mediastinum   Essential hypertension (Chronic)    Patient blood pressure within normal limit continue doing diet and exercise to control hypertension.        Endocrine   Diabetes mellitus type II, controlled, with no complications (HCC) (Chronic)    A1c trending down 0.2%.  Patient was placed on Januvia through his work program which is now ended has not been on it for extended period time per report.  Patient currently maintained on Januvia 50 along with metformin 1000 mg twice daily daily.  We will continue regimen and recheck in 4 months.  Patient to resume exercising once cleared from surgery from left inguinal hernia repair  Refill provided for Januvia 50 mg in office today      Relevant Medications   JANUVIA 50 MG tablet   Other Relevant Orders   CBC   Lipid panel   Comprehensive metabolic panel   POCT glycosylated hemoglobin (Hb A1C) (Completed)   Hyperlipidemia associated with type 2 diabetes mellitus ([place]) - Primary    Barely above goal with last LDL of 101.  Did discuss with patient putting on statin in regards to risk reduction related to diabetes.  To be on pravastatin tolerating medication well.  Continue.  Pending lab results      Relevant Medications   JANUVIA 50 MG tablet   Other Relevant Orders   HgB A1c (Completed)   CBC   Lipid panel   Comprehensive metabolic panel    Return in about 4 months (around 12/11/2021) for DM recheck.    Romilda Garret, NP

## 2021-08-11 NOTE — Assessment & Plan Note (Signed)
Patient blood pressure within normal limit continue doing diet and exercise to control hypertension.

## 2021-08-11 NOTE — Assessment & Plan Note (Signed)
Barely above goal with last LDL of 101.  Did discuss with patient putting on statin in regards to risk reduction related to diabetes.  To be on pravastatin tolerating medication well.  Continue.  Pending lab results

## 2021-08-11 NOTE — Patient Instructions (Signed)
Nice to see you today Continue the medications as is for now I want to see you back in 4 months for a recheck, sooner if you need me

## 2021-08-11 NOTE — Assessment & Plan Note (Addendum)
A1c trending down 0.2%.  Patient was placed on Januvia through his work program which is now ended has not been on it for extended period time per report.  Patient currently maintained on Januvia 50 along with metformin 1000 mg twice daily daily.  We will continue regimen and recheck in 4 months.  Patient to resume exercising once cleared from surgery from left inguinal hernia repair  Refill provided for Januvia 50 mg in office today

## 2021-08-12 LAB — COMPREHENSIVE METABOLIC PANEL
AG Ratio: 1.6 (calc) (ref 1.0–2.5)
ALT: 30 U/L (ref 9–46)
AST: 22 U/L (ref 10–35)
Albumin: 4.6 g/dL (ref 3.6–5.1)
Alkaline phosphatase (APISO): 60 U/L (ref 35–144)
BUN/Creatinine Ratio: 27 (calc) — ABNORMAL HIGH (ref 6–22)
BUN: 26 mg/dL — ABNORMAL HIGH (ref 7–25)
CO2: 22 mmol/L (ref 20–32)
Calcium: 9.6 mg/dL (ref 8.6–10.3)
Chloride: 108 mmol/L (ref 98–110)
Creat: 0.95 mg/dL (ref 0.70–1.35)
Globulin: 2.8 g/dL (calc) (ref 1.9–3.7)
Glucose, Bld: 103 mg/dL — ABNORMAL HIGH (ref 65–99)
Potassium: 4.2 mmol/L (ref 3.5–5.3)
Sodium: 141 mmol/L (ref 135–146)
Total Bilirubin: 0.6 mg/dL (ref 0.2–1.2)
Total Protein: 7.4 g/dL (ref 6.1–8.1)

## 2021-08-12 LAB — CBC
HCT: 41.4 % (ref 38.5–50.0)
Hemoglobin: 13.8 g/dL (ref 13.2–17.1)
MCH: 29.1 pg (ref 27.0–33.0)
MCHC: 33.3 g/dL (ref 32.0–36.0)
MCV: 87.3 fL (ref 80.0–100.0)
MPV: 10.3 fL (ref 7.5–12.5)
Platelets: 246 10*3/uL (ref 140–400)
RBC: 4.74 10*6/uL (ref 4.20–5.80)
RDW: 12.4 % (ref 11.0–15.0)
WBC: 6.8 10*3/uL (ref 3.8–10.8)

## 2021-08-12 LAB — LIPID PANEL
Cholesterol: 155 mg/dL (ref <200)
HDL: 41 mg/dL (ref >=40)
LDL Cholesterol (Calc): 90 mg/dL (calc)
Non-HDL Cholesterol (Calc): 114 mg/dL (calc) (ref <130)
Total CHOL/HDL Ratio: 3.8 (calc) (ref <5.0)
Triglycerides: 137 mg/dL (ref <150)

## 2021-08-12 LAB — SPECIMEN COMPROMISED

## 2021-10-25 ENCOUNTER — Other Ambulatory Visit: Payer: Self-pay | Admitting: Nurse Practitioner

## 2021-10-25 MED ORDER — DEXCOM G6 SENSOR MISC
1.0000 | 1 refills | Status: DC
Start: 2021-10-25 — End: 2021-12-28

## 2021-10-25 MED ORDER — DEXCOM G6 TRANSMITTER MISC: 1.0000 "application " | 1 refills | Status: AC

## 2021-10-25 NOTE — Telephone Encounter (Signed)
  Encourage patient to contact the pharmacy for refills or they can request refills through [facility]  Did the patient contact the pharmacy:  y   LAST APPOINTMENT DATE:  Please schedule appointment if longer than 1 year  NEXT APPOINTMENT DATE:  MEDICATION:Continuous Blood Gluc Sensor (DEXCOM G6 SENSOR) MISC  Continuous Blood Gluc Transmit (DEXCOM G6 TRANSMITTER) MISC  Is the patient out of medication?   If not, how much is left?  Is this a 90 day supply: 3 month  PHARMACY: [id number] [place],  - [address] Phone:  [phone] Fax:  [phone]     Let patient know to contact pharmacy at the end of the day to make sure medication is ready.  Please notify patient to allow 48-72 hours to process

## 2021-10-25 NOTE — Telephone Encounter (Signed)
Patient advised.

## 2021-11-05 ENCOUNTER — Other Ambulatory Visit: Payer: Self-pay | Admitting: Nurse Practitioner

## 2021-11-27 ENCOUNTER — Encounter: Payer: Self-pay | Admitting: Nurse Practitioner

## 2021-11-27 ENCOUNTER — Telehealth: Payer: Self-pay

## 2021-11-27 ENCOUNTER — Ambulatory Visit: Payer: BC Managed Care – PPO | Admitting: Nurse Practitioner

## 2021-11-27 ENCOUNTER — Other Ambulatory Visit: Payer: Self-pay

## 2021-11-27 VITALS — BP 120/74 | HR 66 | Temp 97.8°F | Ht 77.0 in | Wt 205.2 lb

## 2021-11-27 DIAGNOSIS — E1169 Type 2 diabetes mellitus with other specified complication: Secondary | ICD-10-CM | POA: Diagnosis not present

## 2021-11-27 DIAGNOSIS — L853 Xerosis cutis: Secondary | ICD-10-CM

## 2021-11-27 DIAGNOSIS — E119 Type 2 diabetes mellitus without complications: Secondary | ICD-10-CM

## 2021-11-27 DIAGNOSIS — Z1211 Encounter for screening for malignant neoplasm of colon: Secondary | ICD-10-CM

## 2021-11-27 DIAGNOSIS — E785 Hyperlipidemia, unspecified: Secondary | ICD-10-CM

## 2021-11-27 LAB — POCT GLYCOSYLATED HEMOGLOBIN (HGB A1C): Hemoglobin A1C: 6.9 % — AB (ref 4.0–5.6)

## 2021-11-27 MED ORDER — NA SULFATE-K SULFATE-MG SULF 17.5-3.13-1.6 GM/177ML PO SOLN: 1.0000 | Freq: Once | ORAL | 0 refills | Status: AC

## 2021-11-27 NOTE — Telephone Encounter (Signed)
Gastroenterology Pre-Procedure Review  Request Date: 12/29/21 Requesting Physician: Dr. Marius Ditch  PATIENT REVIEW QUESTIONS: The patient responded to the following health history questions as indicated:    1. Are you having any GI issues?  Sometimes pt experiences diarrhea upto 3 times a week  2. Do you have a personal history of Polyps? no 3. Do you have a family history of Colon Cancer or Polyps? no 4. Diabetes Mellitus? yes (type 2 has been advised to hold meformin and jardiance 2 days prior to colonoscopy) 5. Joint replacements in the past 12 months?yes (hernia surgery left side 4 months ago) 6. Major health problems in the past 3 months?no 7. Any artificial heart valves, MVP, or defibrillator?no   MEDICATIONS & ALLERGIES:    Patient reports the following regarding taking any anticoagulation/antiplatelet therapy:   Plavix, Coumadin, Eliquis, Xarelto, Lovenox, Pradaxa, Brilinta, or Effient? no Aspirin? no  Patient confirms/reports the following medications:  Current Outpatient Medications  Medication Sig Dispense Refill   Continuous Blood Gluc Sensor (DEXCOM G6 SENSOR) MISC 1 application  by Does not apply route as directed. 3 each 1   Continuous Blood Gluc Transmit (DEXCOM G6 TRANSMITTER) MISC 1 application  by Does not apply route as directed. 1 each 1   JANUVIA 50 MG tablet Take 1 tablet (50 mg total) by mouth daily. 90 tablet 1   metFORMIN (GLUCOPHAGE-XR) 500 MG 24 hr tablet Take 2 tablets by mouth 2 (two) times daily.     Multiple Vitamin (MULTIVITAMIN) tablet Take 1 tablet by mouth daily.     Multiple Vitamins-Minerals (MULTIVIT/MULTIMINERAL ADULT PO) Take by mouth.     OVER THE COUNTER MEDICATION Berbercap 2 tablets twice daily.     pravastatin (PRAVACHOL) 10 MG tablet TAKE 1 TABLET BY MOUTH EVERY DAY 90 tablet 1   No current facility-administered medications for this visit.    Patient confirms/reports the following allergies:  Allergies  Allergen Reactions   Rosuvastatin      Other reaction(s): Muscle Pain   Tape Rash    No orders of the defined types were placed in this encounter.   AUTHORIZATION INFORMATION Primary Insurance: 1D#: Group #:  Secondary Insurance: 1D#: Group #:  SCHEDULE INFORMATION: Date: 12/29/21 Time: Location: [facility]

## 2021-11-27 NOTE — Assessment & Plan Note (Signed)
Patient currently maintained on metformin 2000 mg daily along with Januvia 50 daily.  Patient tolerating medications well.  Patient's A1c went from 7.0-6.9 today.  Continue taking medication as prescribed continue doing lifestyle modifications.

## 2021-11-27 NOTE — Progress Notes (Signed)
Established Patient Office Visit  Subjective   Patient ID: Omar Willis, male    DOB: 08-Feb-1961  Age: 61 y.o. MRN: [id number]  Chief Complaint  Patient presents with   Follow-up    Diabetes    HPI  6.9 DM2: Still using the Dexocm G6. States that he is between 100-160. States in the morning he is higher.  No hypoglycemia Highest glucose has been 237. States that was when he was eating something that he was not suppose to.   HLD: Patient as started taking 2G of fish oils a day and a B complex Patient states that when he is not at work that he is outside doing work around the house. States that he will go to the gym 5 times a week for an hour to 1:15 a day. He will do both weights and cardio.   Nasal cracking: using saline in the nasals with a q-tip      Review of Systems  Constitutional:  Negative for chills and fever.  Respiratory:  Negative for shortness of breath.   Cardiovascular:  Negative for chest pain.  Gastrointestinal:  Negative for abdominal pain, nausea and vomiting.       BM daily   Neurological:  Negative for tingling and headaches.  Psychiatric/Behavioral:  Negative for hallucinations and suicidal ideas.       Objective:     BP 120/74 (BP Location: Left Arm, Patient Position: Sitting)   Pulse 66   Temp 97.8 F (36.6 C) (Skin)   Ht '6\' 5"'$  (1.956 m)   Wt 205 lb 4 oz (93.1 kg)   SpO2 98%   BMI 24.34 kg/m  BP Readings from Last 3 Encounters:  11/27/21 120/74  08/11/21 122/64  04/28/21 122/64   Wt Readings from Last 3 Encounters:  11/27/21 205 lb 4 oz (93.1 kg)  08/11/21 206 lb 9.6 oz (93.7 kg)  04/28/21 205 lb 8 oz (93.2 kg)      Physical Exam Vitals and nursing note reviewed.  Constitutional:      Appearance: Normal appearance.  Cardiovascular:     Rate and Rhythm: Normal rate and regular rhythm.     Pulses:          Dorsalis pedis pulses are 2+ on the right side and 2+ on the left side.     Heart sounds: Normal heart sounds.   Pulmonary:     Effort: Pulmonary effort is normal.     Breath sounds: Normal breath sounds.  Musculoskeletal:     Right lower leg: No edema.     Left lower leg: No edema.  Feet:     Right foot:     Skin integrity: Skin integrity normal.     Left foot:     Skin integrity: Skin integrity normal.     Comments: Slight coolness to bilateral feet Lymphadenopathy:     Cervical: No cervical adenopathy.  Neurological:     Mental Status: He is alert.      Results for orders placed or performed in visit on 11/27/21  POCT glycosylated hemoglobin (Hb A1C)  Result Value Ref Range   Hemoglobin A1C 6.9 (A) 4.0 - 5.6 %   HbA1c POC (<> result, manual entry)     HbA1c, POC (prediabetic range)     HbA1c, POC (controlled diabetic range)        The 10-year ASCVD risk score (Arnett DK, et al., 2019) is: 15.6%    Assessment & Plan:   Problem List  Items Addressed This Visit       Endocrine   Diabetes mellitus type II, controlled, with no complications ([place]) - Primary (Chronic)    Patient currently maintained on metformin 2000 mg daily along with Januvia 50 daily.  Patient tolerating medications well.  Patient's A1c went from 7.0-6.9 today.  Continue taking medication as prescribed continue doing lifestyle modifications.      Relevant Orders   POCT glycosylated hemoglobin (Hb A1C) (Completed)   Hyperlipidemia associated with type 2 diabetes mellitus ([place])    Patient recently started taking fish oils along with doing his physical activity.  He has been tolerating pravastatin 10 mg.        Musculoskeletal and Integument   Dry skin    Dry skin around the nostril rims.  Patient been using saline.  Told him to use a Q-tip and apply Vaseline around the rim at nighttime that should help with moisture.      Other Visit Diagnoses     Screening for colon cancer       Relevant Orders   Ambulatory referral to Gastroenterology       Return in about 4 months (around 03/30/2022) for DM  recheck .    Romilda Garret, NP

## 2021-11-27 NOTE — Assessment & Plan Note (Signed)
Dry skin around the nostril rims.  Patient been using saline.  Told him to use a Q-tip and apply Vaseline around the rim at nighttime that should help with moisture.

## 2021-11-27 NOTE — Assessment & Plan Note (Signed)
Patient recently started taking fish oils along with doing his physical activity.  He has been tolerating pravastatin 10 mg.

## 2021-11-27 NOTE — Patient Instructions (Signed)
Nice to see you today I want to see you in about 4 months for a recheck, sooner if you need me

## 2021-12-27 ENCOUNTER — Telehealth: Payer: Self-pay | Admitting: Nurse Practitioner

## 2021-12-27 DIAGNOSIS — E119 Type 2 diabetes mellitus without complications: Secondary | ICD-10-CM

## 2021-12-27 NOTE — Telephone Encounter (Signed)
Patient called and stated he has problems with the Dexcom with some skin issues and want to know can he be switched over freestyle lybre. Call back number [phone].

## 2021-12-27 NOTE — Telephone Encounter (Signed)
Should this request go through endocrinologist?

## 2021-12-28 MED ORDER — FREESTYLE LIBRE 3 SENSOR MISC: 1.0000 | 3 refills | Status: DC

## 2021-12-28 NOTE — Telephone Encounter (Signed)
I do not think he sees endocrine. I manage his DM.  Free style libre 3 sensor sent into pharmacy

## 2021-12-28 NOTE — Telephone Encounter (Signed)
Patient advised.

## 2021-12-29 ENCOUNTER — Other Ambulatory Visit: Payer: Self-pay

## 2021-12-29 ENCOUNTER — Ambulatory Visit
Admission: RE | Admit: 2021-12-29 | Discharge: 2021-12-29 | Disposition: A | Discharge status: Home / Self Care | Payer: BC Managed Care – PPO | Source: Ambulatory Visit | Attending: Gastroenterology | Admitting: Gastroenterology

## 2021-12-29 ENCOUNTER — Encounter
Admission: RE | Disposition: A | Discharge status: Home / Self Care | Payer: Self-pay | Source: Ambulatory Visit | Attending: Gastroenterology

## 2021-12-29 ENCOUNTER — Encounter: Payer: Self-pay | Admitting: Gastroenterology

## 2021-12-29 ENCOUNTER — Ambulatory Visit: Payer: BC Managed Care – PPO | Admitting: Certified Registered"

## 2021-12-29 DIAGNOSIS — G4730 Sleep apnea, unspecified: Secondary | ICD-10-CM | POA: Insufficient documentation

## 2021-12-29 DIAGNOSIS — Z7984 Long term (current) use of oral hypoglycemic drugs: Secondary | ICD-10-CM | POA: Diagnosis not present

## 2021-12-29 DIAGNOSIS — K5730 Diverticulosis of large intestine without perforation or abscess without bleeding: Secondary | ICD-10-CM | POA: Insufficient documentation

## 2021-12-29 DIAGNOSIS — Z1211 Encounter for screening for malignant neoplasm of colon: Secondary | ICD-10-CM | POA: Diagnosis not present

## 2021-12-29 DIAGNOSIS — E119 Type 2 diabetes mellitus without complications: Secondary | ICD-10-CM | POA: Diagnosis not present

## 2021-12-29 DIAGNOSIS — Z79899 Other long term (current) drug therapy: Secondary | ICD-10-CM | POA: Diagnosis not present

## 2021-12-29 DIAGNOSIS — K219 Gastro-esophageal reflux disease without esophagitis: Secondary | ICD-10-CM | POA: Insufficient documentation

## 2021-12-29 DIAGNOSIS — Z87891 Personal history of nicotine dependence: Secondary | ICD-10-CM | POA: Insufficient documentation

## 2021-12-29 HISTORY — PX: COLONOSCOPY WITH PROPOFOL: SHX5780

## 2021-12-29 LAB — GLUCOSE, CAPILLARY
Glucose-Capillary: 200 mg/dL — ABNORMAL HIGH (ref 70–99)
Glucose-Capillary: 169 mg/dL — ABNORMAL HIGH (ref 70–99)

## 2021-12-29 SURGERY — COLONOSCOPY WITH PROPOFOL
Anesthesia: General

## 2021-12-29 MED ORDER — SODIUM CHLORIDE 0.9 % IV SOLN: INTRAVENOUS | Status: DC

## 2021-12-29 MED ORDER — MIDAZOLAM HCL 2 MG/2ML IJ SOLN
INTRAMUSCULAR | Status: DC | PRN
  Administered 2021-12-29: 2 mg via INTRAVENOUS

## 2021-12-29 MED ORDER — SODIUM CHLORIDE 0.9 % IV SOLN: INTRAVENOUS | Status: DC | PRN

## 2021-12-29 MED ORDER — KETAMINE HCL 50 MG/5ML IJ SOSY
PREFILLED_SYRINGE | INTRAMUSCULAR | Status: AC
  Filled 2021-12-29: qty 5

## 2021-12-29 MED ORDER — PROPOFOL 500 MG/50ML IV EMUL
INTRAVENOUS | Status: DC | PRN
  Administered 2021-12-29: 125 ug/kg/min via INTRAVENOUS

## 2021-12-29 MED ORDER — PROPOFOL 10 MG/ML IV BOLUS
INTRAVENOUS | Status: DC | PRN
  Administered 2021-12-29: 50 mg via INTRAVENOUS

## 2021-12-29 MED ORDER — KETAMINE HCL 10 MG/ML IJ SOLN
INTRAMUSCULAR | Status: DC | PRN
  Administered 2021-12-29: 20 mg via INTRAVENOUS

## 2021-12-29 MED ORDER — MIDAZOLAM HCL 2 MG/2ML IJ SOLN
INTRAMUSCULAR | Status: AC
  Filled 2021-12-29: qty 2

## 2021-12-29 NOTE — Anesthesia Postprocedure Evaluation (Signed)
Anesthesia Post Note  Patient: Omar Willis  Procedure(s) Performed: COLONOSCOPY WITH PROPOFOL  Patient location during evaluation: PACU Anesthesia Type: General Level of consciousness: awake Pain management: satisfactory to patient Vital Signs Assessment: post-procedure vital signs reviewed and stable Respiratory status: spontaneous breathing and nonlabored ventilation Anesthetic complications: no  No notable events documented.   Last Vitals:  Vitals:   12/29/21 1302 12/29/21 1311  BP: 116/85 128/84  Pulse: 79 67  Resp: 12 11  Temp:    SpO2: 97% 97%    Last Pain:  Vitals:   12/29/21 1311  TempSrc:   PainSc: 0-No pain                 VAN STAVEREN,Omar Willis
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
## 2021-12-29 NOTE — Transfer of Care (Addendum)
Immediate Anesthesia Transfer of Care Note  Patient: Omar Willis  Procedure(s) Performed: COLONOSCOPY WITH PROPOFOL  Patient Location: PACU and Endoscopy Unit  Anesthesia Type:MAC  Level of Consciousness: drowsy  Airway & Oxygen Therapy: Patient Spontanous Breathing and Patient connected to nasal cannula oxygen  Post-op Assessment: Report given to RN and Post -op Vital signs reviewed and stable  Post vital signs: Reviewed and stable  Last Vitals:  Vitals Value Taken Time  BP    Temp    Pulse    Resp    SpO2      Last Pain:  Vitals:   12/29/21 1147  TempSrc: Temporal  PainSc: 0-No pain         Complications: No notable events documented.
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
## 2021-12-29 NOTE — Op Note (Signed)
[facility] Gastroenterology Patient Name: Omar Willis Procedure Date: 12/29/2021 12:14 PM MRN: [id number] Account #: [id number] Date of Birth: 11/27/1960 Admit Type: Outpatient Age: 61 Room: [facility] ENDO ROOM 1 Gender: Male Note Status: Finalized Instrument Name: Jasper Riling [id number] Procedure:             Colonoscopy Indications:           Screening for colorectal malignant neoplasm, Last                         colonoscopy 10 years ago Providers:             Lin Landsman MD, MD Referring MD:          Alyson Locket. Charmian Muff (Referring MD) Medicines:             General Anesthesia Complications:         No immediate complications. Estimated blood loss: None. Procedure:             Pre-Anesthesia Assessment:                        - Prior to the procedure, a History and Physical was                         performed, and patient medications and allergies were                         reviewed. The patient is competent. The risks and                         benefits of the procedure and the sedation options and                         risks were discussed with the patient. All questions                         were answered and informed consent was obtained.                         Patient identification and proposed procedure were                         verified by the physician, the nurse, the                         anesthesiologist, the anesthetist and the technician                         in the pre-procedure area in the procedure room in the                         endoscopy suite. Mental Status Examination: alert and                         oriented. Airway Examination: normal oropharyngeal                         airway and neck mobility. Respiratory Examination:  clear to auscultation. CV Examination: normal.                         Prophylactic Antibiotics: The patient does not require                         prophylactic  antibiotics. Prior Anticoagulants: The                         patient has taken no anticoagulant or antiplatelet                         agents. ASA Grade Assessment: II - A patient with mild                         systemic disease. After reviewing the risks and                         benefits, the patient was deemed in satisfactory                         condition to undergo the procedure. The anesthesia                         plan was to use general anesthesia. Immediately prior                         to administration of medications, the patient was                         re-assessed for adequacy to receive sedatives. The                         heart rate, respiratory rate, oxygen saturations,                         blood pressure, adequacy of pulmonary ventilation, and                         response to care were monitored throughout the                         procedure. The physical status of the patient was                         re-assessed after the procedure.                        After obtaining informed consent, the colonoscope was                         passed under direct vision. Throughout the procedure,                         the patient's blood pressure, pulse, and oxygen                         saturations were monitored continuously. The  Colonoscope was introduced through the anus and                         advanced to the the cecum, identified by appendiceal                         orifice and ileocecal valve. The colonoscopy was                         performed without difficulty. The patient tolerated                         the procedure well. The quality of the bowel                         preparation was evaluated using the BBPS [facility] Bowel                         Preparation Scale) with scores of: Right Colon = 3,                         Transverse Colon = 3 and Left Colon = 3 (entire mucosa                         seen  well with no residual staining, small fragments                         of stool or opaque liquid). The total BBPS score                         equals 9. The ileocecal valve, appendiceal orifice,                         and rectum were photographed. Findings:      The perianal and digital rectal examinations were normal. Pertinent       negatives include normal sphincter tone and no palpable rectal lesions.      The entire examined colon appeared normal.      The retroflexed view of the distal rectum and anal verge was normal and       showed no anal or rectal abnormalities.      A single medium-mouthed diverticulum was found in the cecum. Impression:            - The entire examined colon is normal.                        - The distal rectum and anal verge are normal on                         retroflexion view.                        - Diverticulosis in the cecum.                        - No specimens collected. Recommendation:        - Discharge patient to home (with escort).                        -  Resume previous diet today.                        - Continue present medications.                        - Repeat colonoscopy in 10 years for screening                         purposes. Procedure Code(s):     --- Professional ---                        P8242, Colorectal cancer screening; colonoscopy on                         individual not meeting criteria for high risk Diagnosis Code(s):     --- Professional ---                        Z12.11, Encounter for screening for malignant neoplasm                         of colon CPT copyright 2022 American [employer] Association. All rights reserved. The codes documented in this report are preliminary and upon coder review may  be revised to meet current compliance requirements. Dr. Ulyess Mort Lin Landsman MD, MD 12/29/2021 12:48:54 PM This report has been signed electronically. Number of Addenda: 0 Note Initiated On: 12/29/2021  12:14 PM Scope Withdrawal Time: 0 hours 10 minutes 29 seconds  Total Procedure Duration: 0 hours 14 minutes 9 seconds  Estimated Blood Loss:  Estimated blood loss: none. Estimated blood loss: none.      [facility]

## 2022-01-01 ENCOUNTER — Encounter: Payer: Self-pay | Admitting: Gastroenterology

## 2022-01-12 DIAGNOSIS — E119 Type 2 diabetes mellitus without complications: Secondary | ICD-10-CM | POA: Diagnosis not present

## 2022-01-12 DIAGNOSIS — K589 Irritable bowel syndrome without diarrhea: Secondary | ICD-10-CM | POA: Diagnosis not present

## 2022-01-12 DIAGNOSIS — W57XXXA Bitten or stung by nonvenomous insect and other nonvenomous arthropods, initial encounter: Secondary | ICD-10-CM | POA: Diagnosis not present

## 2022-01-12 DIAGNOSIS — Z77120 Contact with and (suspected) exposure to mold (toxic): Secondary | ICD-10-CM | POA: Diagnosis not present

## 2022-01-12 DIAGNOSIS — E7800 Pure hypercholesterolemia, unspecified: Secondary | ICD-10-CM | POA: Diagnosis not present

## 2022-02-07 ENCOUNTER — Other Ambulatory Visit: Payer: Self-pay | Admitting: Nurse Practitioner

## 2022-02-07 DIAGNOSIS — E119 Type 2 diabetes mellitus without complications: Secondary | ICD-10-CM

## 2022-02-13 DIAGNOSIS — E291 Testicular hypofunction: Secondary | ICD-10-CM | POA: Diagnosis not present

## 2022-02-13 DIAGNOSIS — Z125 Encounter for screening for malignant neoplasm of prostate: Secondary | ICD-10-CM | POA: Diagnosis not present

## 2022-03-30 ENCOUNTER — Telehealth: Payer: Self-pay | Admitting: Nurse Practitioner
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
## 2022-03-30 NOTE — Telephone Encounter (Signed)
noted 
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
## 2022-04-02 ENCOUNTER — Ambulatory Visit: Payer: BC Managed Care – PPO | Admitting: Nurse Practitioner

## 2022-04-14 ENCOUNTER — Other Ambulatory Visit: Payer: Self-pay | Admitting: Nurse Practitioner

## 2022-04-14 DIAGNOSIS — E119 Type 2 diabetes mellitus without complications: Secondary | ICD-10-CM

## 2022-06-18 DIAGNOSIS — E119 Type 2 diabetes mellitus without complications: Secondary | ICD-10-CM | POA: Diagnosis not present

## 2022-06-18 DIAGNOSIS — H40013 Open angle with borderline findings, low risk, bilateral: Secondary | ICD-10-CM | POA: Diagnosis not present

## 2022-06-18 DIAGNOSIS — H2513 Age-related nuclear cataract, bilateral: Secondary | ICD-10-CM | POA: Diagnosis not present

## 2022-06-18 DIAGNOSIS — H16223 Keratoconjunctivitis sicca, not specified as Sjogren's, bilateral: Secondary | ICD-10-CM | POA: Diagnosis not present

## 2022-06-29 DIAGNOSIS — Z5181 Encounter for therapeutic drug level monitoring: Secondary | ICD-10-CM | POA: Diagnosis not present

## 2022-06-29 DIAGNOSIS — Z79890 Hormone replacement therapy (postmenopausal): Secondary | ICD-10-CM | POA: Diagnosis not present

## 2022-06-29 DIAGNOSIS — R7303 Prediabetes: Secondary | ICD-10-CM | POA: Diagnosis not present

## 2022-07-16 DIAGNOSIS — H16223 Keratoconjunctivitis sicca, not specified as Sjogren's, bilateral: Secondary | ICD-10-CM | POA: Diagnosis not present

## 2022-07-16 DIAGNOSIS — H40053 Ocular hypertension, bilateral: Secondary | ICD-10-CM | POA: Diagnosis not present

## 2022-07-16 DIAGNOSIS — E119 Type 2 diabetes mellitus without complications: Secondary | ICD-10-CM | POA: Diagnosis not present

## 2022-07-23 ENCOUNTER — Telehealth: Payer: Self-pay | Admitting: Nurse Practitioner

## 2022-07-23 NOTE — Telephone Encounter (Signed)
Got notice from his insurance that his las A1c was above 7.0. not sure where it was done bu the patient is overdue an office visit

## 2022-07-24 NOTE — Telephone Encounter (Signed)
Spoke to pt, pt states he is no longer going to see [facility] anymore.

## 2022-08-27 DIAGNOSIS — H40053 Ocular hypertension, bilateral: Secondary | ICD-10-CM | POA: Diagnosis not present

## 2022-08-27 DIAGNOSIS — H16223 Keratoconjunctivitis sicca, not specified as Sjogren's, bilateral: Secondary | ICD-10-CM | POA: Diagnosis not present

## 2022-12-31 DIAGNOSIS — H40053 Ocular hypertension, bilateral: Secondary | ICD-10-CM | POA: Diagnosis not present

## 2022-12-31 DIAGNOSIS — H16223 Keratoconjunctivitis sicca, not specified as Sjogren's, bilateral: Secondary | ICD-10-CM | POA: Diagnosis not present

## 2023-02-11 DIAGNOSIS — H40053 Ocular hypertension, bilateral: Secondary | ICD-10-CM | POA: Diagnosis not present

## 2023-02-11 DIAGNOSIS — E119 Type 2 diabetes mellitus without complications: Secondary | ICD-10-CM | POA: Diagnosis not present

## 2023-04-08 DIAGNOSIS — L718 Other rosacea: Secondary | ICD-10-CM | POA: Diagnosis not present

## 2023-04-08 DIAGNOSIS — E119 Type 2 diabetes mellitus without complications: Secondary | ICD-10-CM | POA: Diagnosis not present

## 2023-04-08 DIAGNOSIS — H16223 Keratoconjunctivitis sicca, not specified as Sjogren's, bilateral: Secondary | ICD-10-CM | POA: Diagnosis not present

## 2023-04-08 DIAGNOSIS — H40053 Ocular hypertension, bilateral: Secondary | ICD-10-CM | POA: Diagnosis not present

## 2023-04-08 DIAGNOSIS — H0288B Meibomian gland dysfunction left eye, upper and lower eyelids: Secondary | ICD-10-CM | POA: Diagnosis not present

## 2023-04-08 DIAGNOSIS — H0288A Meibomian gland dysfunction right eye, upper and lower eyelids: Secondary | ICD-10-CM | POA: Diagnosis not present

## 2023-07-11 DIAGNOSIS — H2513 Age-related nuclear cataract, bilateral: Secondary | ICD-10-CM | POA: Diagnosis not present

## 2023-07-11 DIAGNOSIS — E119 Type 2 diabetes mellitus without complications: Secondary | ICD-10-CM | POA: Diagnosis not present

## 2023-07-11 DIAGNOSIS — H40053 Ocular hypertension, bilateral: Secondary | ICD-10-CM | POA: Diagnosis not present

## 2023-07-11 DIAGNOSIS — H16223 Keratoconjunctivitis sicca, not specified as Sjogren's, bilateral: Secondary | ICD-10-CM | POA: Diagnosis not present

## 2023-10-19 ENCOUNTER — Other Ambulatory Visit (HOSPITAL_COMMUNITY): Payer: Self-pay

## 2023-12-27 DIAGNOSIS — Z0000 Encounter for general adult medical examination without abnormal findings: Secondary | ICD-10-CM | POA: Diagnosis not present

## 2023-12-27 DIAGNOSIS — E119 Type 2 diabetes mellitus without complications: Secondary | ICD-10-CM | POA: Diagnosis not present

## 2024-01-03 ENCOUNTER — Ambulatory Visit: Admitting: Urology

## 2024-01-03 VITALS — BP 154/93 | HR 87 | Wt 199.7 lb

## 2024-01-03 DIAGNOSIS — N529 Male erectile dysfunction, unspecified: Secondary | ICD-10-CM | POA: Diagnosis not present

## 2024-01-03 DIAGNOSIS — E291 Testicular hypofunction: Secondary | ICD-10-CM | POA: Diagnosis not present

## 2024-01-03 DIAGNOSIS — R399 Unspecified symptoms and signs involving the genitourinary system: Secondary | ICD-10-CM

## 2024-01-03 DIAGNOSIS — Z125 Encounter for screening for malignant neoplasm of prostate: Secondary | ICD-10-CM | POA: Diagnosis not present

## 2024-01-03 MED ORDER — TADALAFIL 5 MG PO TABS: 5.0000 mg | ORAL_TABLET | Freq: Every day | ORAL | 11 refills | Status: DC

## 2024-01-03 NOTE — Patient Instructions (Signed)
 Nocturia refers to the need to wake up during the night to urinate, which can disrupt your sleep and impact your overall well-being. Fortunately, there are several strategies you can employ to help prevent or manage nocturia. It's important to consult with your healthcare provider before making any significant changes to your routine. Here are some helpful strategies to consider:  Sleep apnea.  If you have sleep apnea using CPAP machine can significant decrease the amount of urination overnight  Limit Fluid Intake Before Bed: Avoid drinking large amounts of fluids in the evening, especially within a few hours of bedtime. Consume most of your daily fluid intake earlier in the day to reduce the need to urinate at night.  Monitor Your Diet: Limit your intake of caffeine and alcohol, as these substances can increase urine production and irritate the bladder.  Avoid diet, zero calorie, and artificially sweetened drinks, especially sodas, in the afternoon or evening. Be mindful of consuming foods and drinks with high water content before bedtime, such as watermelon and herbal teas.  Time Your Medications: If you're taking medications that contribute to increased urination, consult your healthcare provider about adjusting the timing of these medications to minimize their impact during the night.  Practice Double Voiding: Before going to bed, make an effort to empty your bladder twice within a short period. This can help reduce the amount of urine left in your bladder before sleep.  Bladder Training: Gradually increase the time between bathroom visits during the day to train your bladder to hold larger volumes of urine. Over time, this can help reduce the frequency of nighttime awakenings to urinate.  Elevate Your Legs During the Day: Elevating your legs during the day can help minimize fluid retention in your lower extremities, which might reduce nighttime urination.  Pelvic Floor  Exercises: Strengthening your pelvic floor muscles through Kegel exercises can help improve bladder control and potentially reduce the urge to urinate at night.  Create a Relaxing Bedtime Routine: Stress and anxiety can exacerbate nocturia. Engage in calming activities before bed, such as reading, listening to soothing music, or practicing relaxation techniques.  Stay Active: Engage in regular physical activity, but avoid intense exercise close to bedtime, as this can increase your body's demand for fluids.  Maintain a Healthy Weight: Excess weight can compress the bladder and contribute to bladder and urinary issues. Aim to achieve and maintain a healthy weight through a balanced diet and regular exercise.  Remember that every individual is unique, and the effectiveness of these strategies may vary. It's important to work with your healthcare provider to develop a plan that suits your specific needs and addresses any underlying causes of nocturia.   Understanding Erectile Dysfunction (ED)  What is ED? Erectile Dysfunction, or ED, is when a man has trouble getting or keeping an erection enough for sex. It is common and can happen at any age but is more common as men get older.  What Causes ED? ED can happen for many reasons, including:    Stress or feeling anxious Health problems like diabetes, heart disease, or high blood pressure Lifestyle habits like smoking, drinking too much alcohol, drug use, or not enough exercise Certain medicines(some blood pressure medications, antidepressants, sedatives)  Symptoms of ED:   Difficulty getting an erection Trouble keeping an erection during sex Reduced interest in sex  Treatment Options There are different ways to treat ED. Talk to your doctor to find what's best for you.  Lifestyle Changes   Exercise regularly Eat  healthy foods Quit smoking and limit alcohol Weight loss Reduce stress  Medications   Oral medicines like  Viagra(sildenafil) or Cialis(tadalafil).  These are generic and affordable, the lowest price is at [email or web address].com.  These must be taken about an hour before sexual activity, and still require sexual stimulation to get an erection Side effects can include stuffy nose, headache, muscle pain, or changes in vision There is no risk of heart attack or stroke when medications are taken as directed These medications cannot be taken with nitrates for chest pain  Other Treatments    Penile injections-a medicine is injected directly into the penis to give you an immediate erection Devices like pump vacuum systems that help create an erection Surgery: Penile prosthesis for patients with no improvement with medicines or injections who are still interested in sexual activity.  Visit [email or web address] for more details.  Dr. Lovie is a urologist in [facility] with [facility] who performs penile prosthesis surgeries Counseling or Therapy    If ED is caused by stress, anxiety, or relationship issues, talking to a counselor or therapist can help.  Understanding BPH (Benign Prostatic Hyperplasia)  What is BPH? BPH stands for Benign Prostatic Hyperplasia. It is a common condition that affects many men as they get older. It happens when the prostate gland, which is part of the male reproductive system, gets bigger. This can cause problems when urinating.  What Are the Symptoms?    Feeling like you need to urinate often, especially at night Trouble starting urinating or weak stream Sudden urge to urinate Feeling that your bladder has not emptied completely  Why Does It Happen? As men age, the prostate naturally gets bigger. This can press against the tube that carries urine out of the bladder, making it hard to urinate.  How Is BPH Treated? There are different ways to treat BPH, depending on how severe the symptoms are:  Lifestyle Changes    Reduce drinks before bedtime Avoid  diet/flavored drinks, caffeine, and alcohol Practice double voiding (urinating twice during a trip to the bathroom)  Medications    Alpha-blockers (like tamsulosin/flomax) to relax the muscles in the prostate and bladder neck 5-alpha reductase inhibitors (like finasteride) to shrink the prostate  If your symptoms do not respond to medications, you have side effect from medications, or would just like to get off of medications, there are multiple surgical options to improve urination.  Procedures and Surgery(Urolift and HoLEP)    UroLift: ~15-minute outpatient procedure performed in the operating room where the prostate is pinned open with small clips.  Patient's discharge the same day, rarely needed catheter, no risk of urinary leakage or erection problems.  Very low risk of bleeding or infection.  It is common to have some temporary irritative symptoms for 1 to 2 weeks after surgery including burning with urination, urgency, frequency.  This is a good option for small prostate or patients with mild to moderate symptoms, but may not be the best long-term treatment if you have a very large prostate, have urinary retention requiring a catheter, or have severe symptoms  HoLEP(holmium laser enucleation of the prostate): ~1 hour outpatient procedure performed in the operating room where a laser is used to hollow out a large channel through the prostate.  The procedure was performed through the urethra, so there are no cuts or incisions.  You will need a catheter for 2 days afterwards.  Low risk of bleeding, infection, or damage to the bladder or ureters.  This is the  gold standard BPH procedure.  Common to have urinary urgency, frequency, and leakage temporarily afterwards, but <2% risk of long-term incontinence.  This is the best treatment if you have a very large prostate, retention requiring a Foley catheter, or have severe symptoms.
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
## 2024-02-18 ENCOUNTER — Ambulatory Visit: Admitting: Urology

## 2024-02-18 ENCOUNTER — Encounter: Payer: Self-pay | Admitting: Urology

## 2024-02-18 VITALS — BP 138/82 | HR 96 | Ht 76.0 in | Wt 202.0 lb

## 2024-02-18 DIAGNOSIS — N486 Induration penis plastica: Secondary | ICD-10-CM

## 2024-02-18 DIAGNOSIS — N138 Other obstructive and reflux uropathy: Secondary | ICD-10-CM

## 2024-02-18 DIAGNOSIS — N529 Male erectile dysfunction, unspecified: Secondary | ICD-10-CM | POA: Diagnosis not present

## 2024-02-18 DIAGNOSIS — N401 Enlarged prostate with lower urinary tract symptoms: Secondary | ICD-10-CM

## 2024-02-18 LAB — MICROSCOPIC EXAMINATION

## 2024-02-18 LAB — URINALYSIS, COMPLETE
Bilirubin, UA: NEGATIVE
Leukocytes,UA: NEGATIVE
Nitrite, UA: NEGATIVE
Protein,UA: NEGATIVE
RBC, UA: NEGATIVE
Specific Gravity, UA: 1.02 (ref 1.005–1.030)
Urobilinogen, Ur: 0.2 mg/dL (ref 0.2–1.0)
pH, UA: 6 (ref 5.0–7.5)

## 2024-02-18 LAB — BLADDER SCAN AMB NON-IMAGING

## 2024-02-18 MED ORDER — TADALAFIL 10 MG PO TABS: 10.0000 mg | ORAL_TABLET | Freq: Every day | ORAL | 3 refills | Status: AC | PRN
